# Patient Record
Sex: Female | Born: 1940
Health system: Southern US, Community
[De-identification: ages and names within clinical notes are randomized; demographics above are authoritative.]

## PROBLEM LIST (undated history)

## (undated) DIAGNOSIS — M1990 Unspecified osteoarthritis, unspecified site: Secondary | ICD-10-CM

## (undated) DIAGNOSIS — M5430 Sciatica, unspecified side: Secondary | ICD-10-CM

## (undated) DIAGNOSIS — Z972 Presence of dental prosthetic device (complete) (partial): Secondary | ICD-10-CM

## (undated) DIAGNOSIS — N184 Chronic kidney disease, stage 4 (severe): Secondary | ICD-10-CM

## (undated) DIAGNOSIS — M109 Gout, unspecified: Secondary | ICD-10-CM

## (undated) DIAGNOSIS — I10 Essential (primary) hypertension: Secondary | ICD-10-CM

## (undated) DIAGNOSIS — E039 Hypothyroidism, unspecified: Secondary | ICD-10-CM

## (undated) HISTORY — PX: TONSILLECTOMY AND ADENOIDECTOMY: SUR1326

## (undated) HISTORY — PX: COLONOSCOPY: SHX174

## (undated) HISTORY — DX: Essential (primary) hypertension: I10

## (undated) HISTORY — DX: Morbid (severe) obesity due to excess calories: E66.01

---
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
## 2005-02-14 ENCOUNTER — Ambulatory Visit: Payer: Self-pay | Admitting: Nephrology

## 2006-12-05 IMAGING — US US RENAL KIDNEY
1 series · 17 of 25 positions shown · non-contrast
Comparison: none

REASON FOR EXAM: chronic kidney disease    measure kidney size
COMMENTS:

[Series 1: us renal kidney · 17 of 28 slices shown]
[im 1/28]
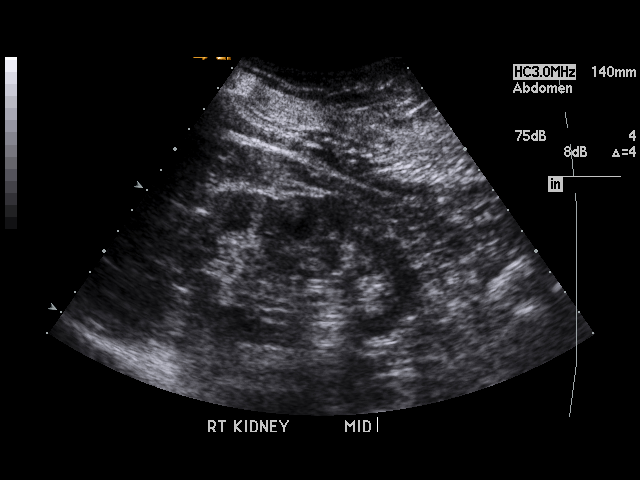
[im 3/28]
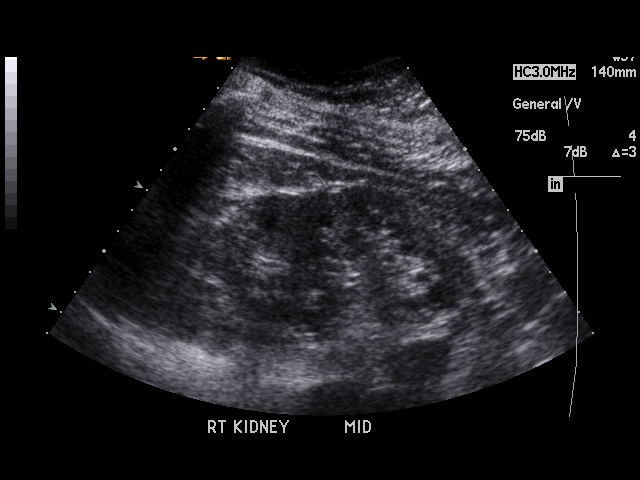
[im 4/28]
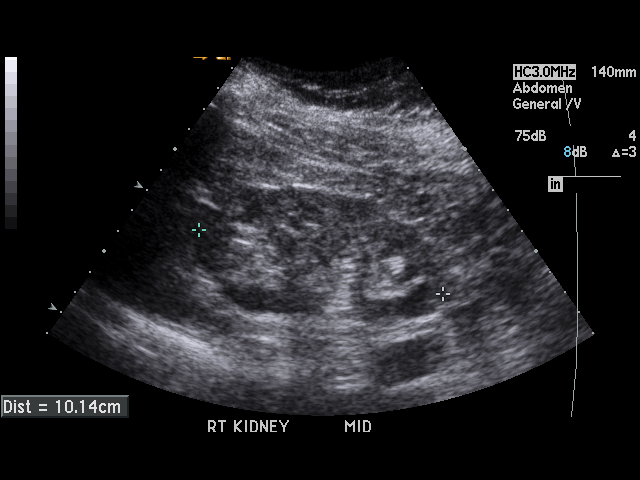
[im 6/28]
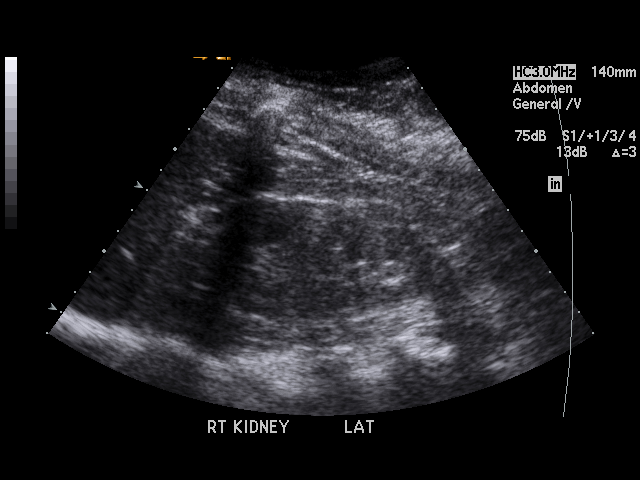
[im 7/28]
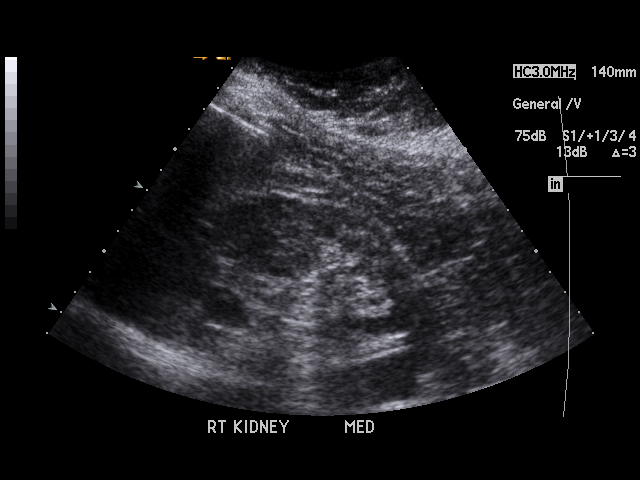
[im 10/28]
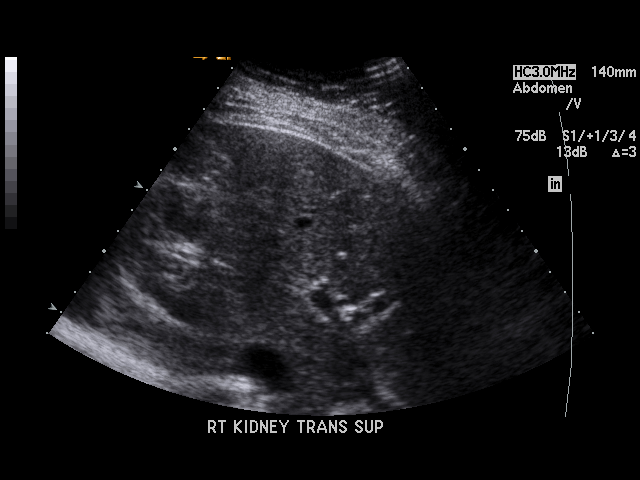
[im 11/28]
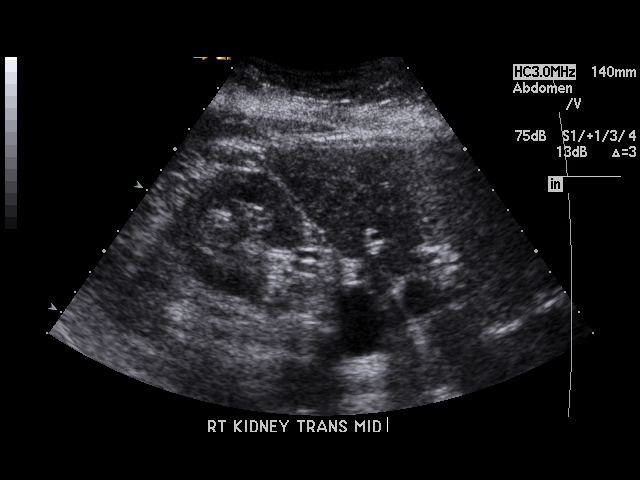
[im 13/28]
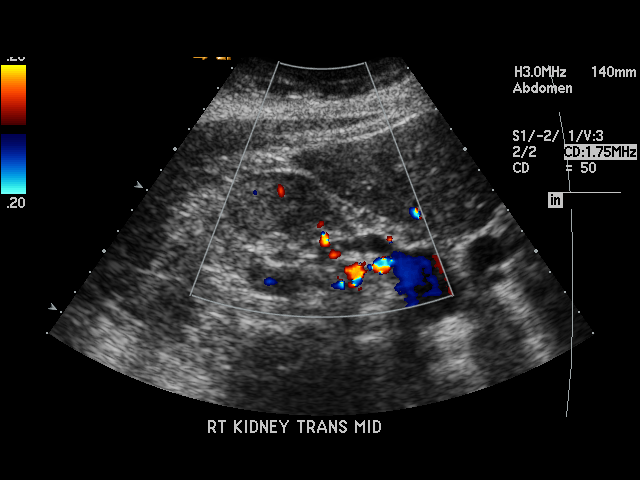
[im 14/28]
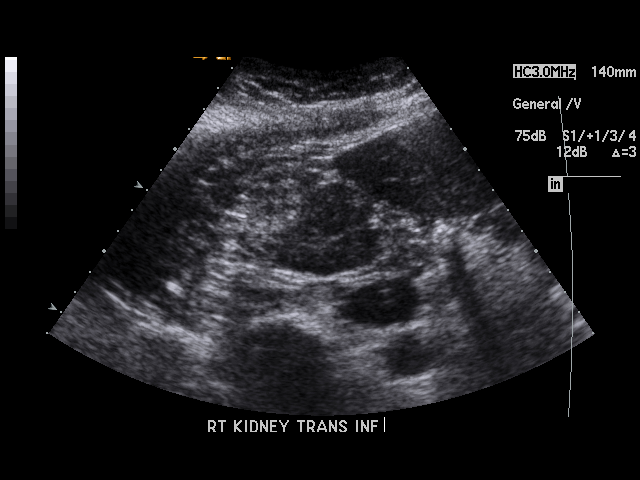
[im 15/28]
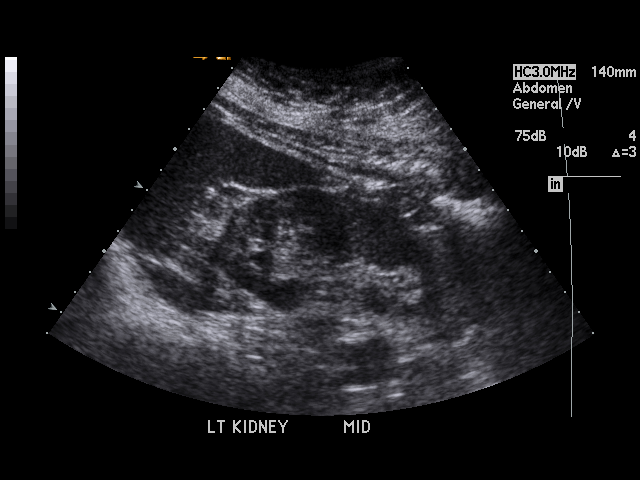
[im 17/28]
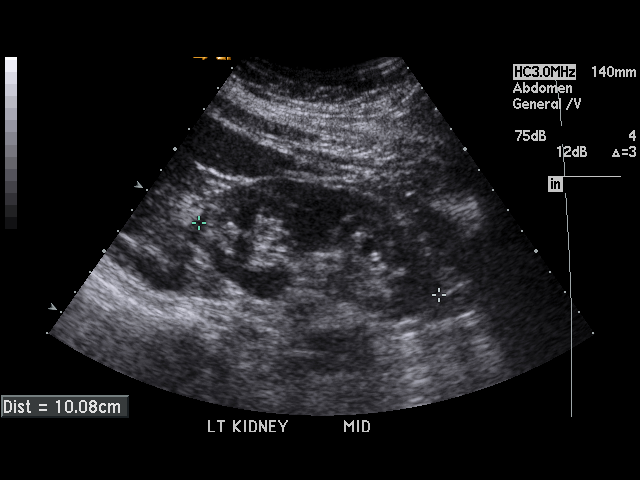
[im 19/28]
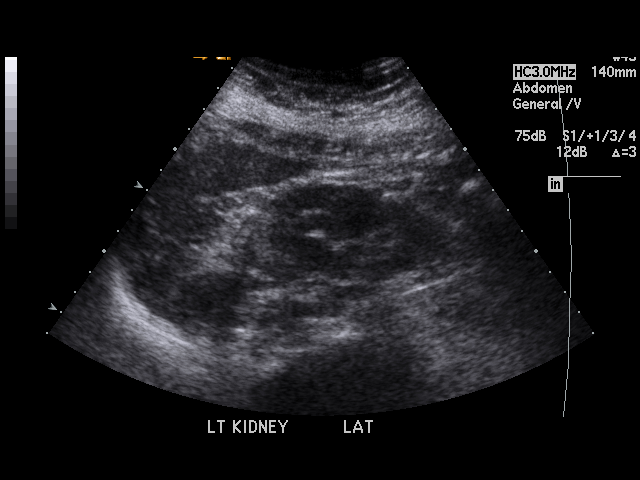
[im 21/28]
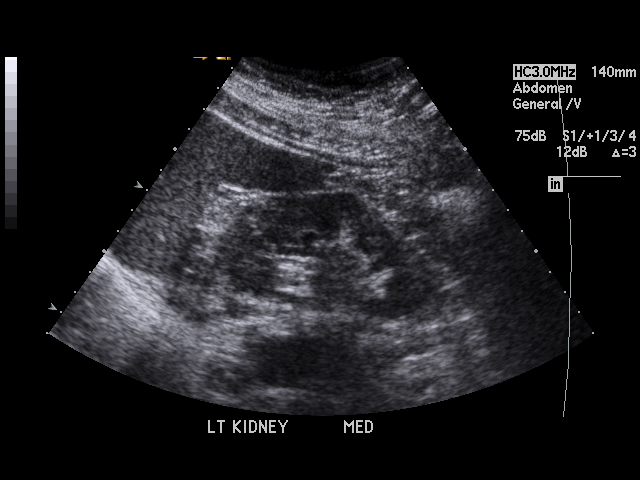
[im 22/28]
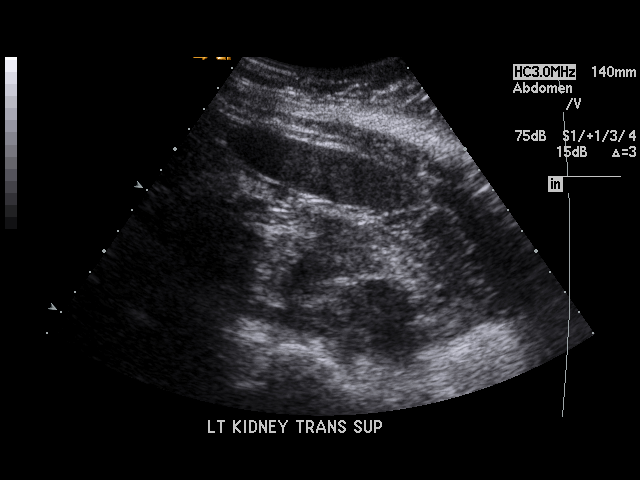
[im 24/28]
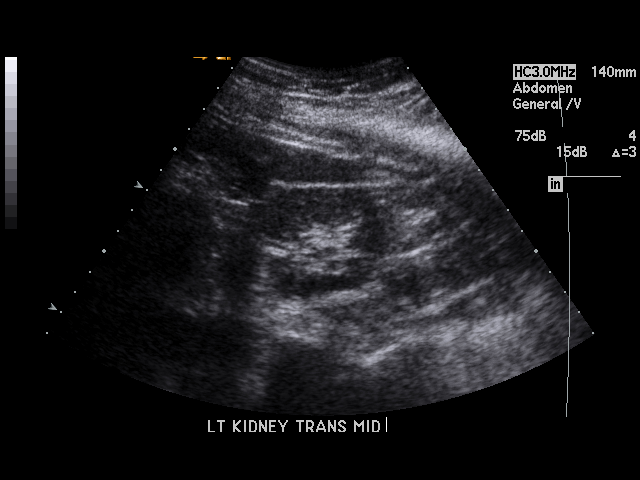
[im 25/28]
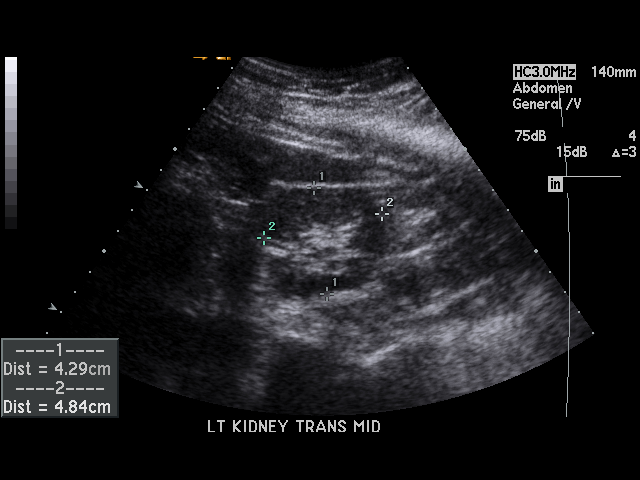
[im 28/28]
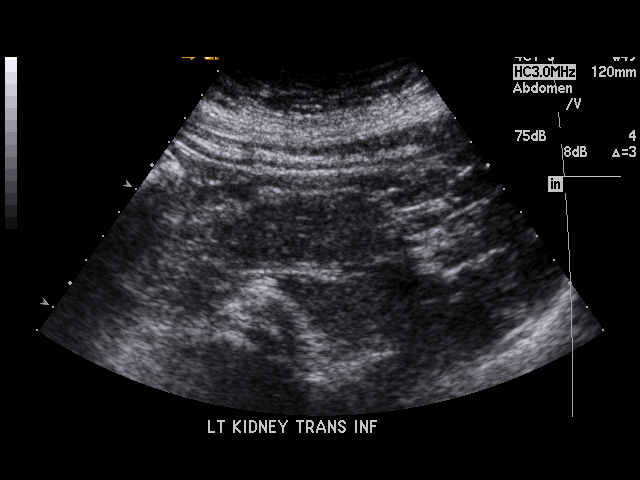

[17 of 25 positions shown; findings below may reference images not displayed]

PROCEDURE:     US  - US KIDNEY BILATERAL  - February 14, 2005  [DATE]

RESULT:     The study is limited because of the patient's morbid obesity.
The RIGHT kidney measures 10.1 cm x 4.8 cm x 3.9 cm and the LEFT kidney
measures 10.1 x 4.3 x 4.8 cm.  No hydronephrosis or definite cystic or solid
mass is appreciated. No stones are evident.
IMPRESSION: 1)Limited study technically, but no gross abnormality is seen.

## 2010-07-15 DIAGNOSIS — N184 Chronic kidney disease, stage 4 (severe): Secondary | ICD-10-CM | POA: Insufficient documentation

## 2011-12-04 ENCOUNTER — Ambulatory Visit: Payer: Self-pay | Admitting: Internal Medicine

## 2014-06-22 DIAGNOSIS — E039 Hypothyroidism, unspecified: Secondary | ICD-10-CM | POA: Diagnosis not present

## 2014-06-22 DIAGNOSIS — Z23 Encounter for immunization: Secondary | ICD-10-CM | POA: Diagnosis not present

## 2014-06-22 DIAGNOSIS — I129 Hypertensive chronic kidney disease with stage 1 through stage 4 chronic kidney disease, or unspecified chronic kidney disease: Secondary | ICD-10-CM | POA: Diagnosis not present

## 2014-06-22 DIAGNOSIS — Z0000 Encounter for general adult medical examination without abnormal findings: Secondary | ICD-10-CM | POA: Diagnosis not present

## 2014-06-22 LAB — TSH: TSH: 1.65 u[IU]/mL (ref 0.41–5.90)

## 2014-06-22 LAB — MICROALBUMIN, URINE: MICROALB UR: 0

## 2014-06-22 LAB — BASIC METABOLIC PANEL
BUN: 42 mg/dL — AB (ref 4–21)
CREATININE: 2.8 mg/dL — AB (ref 0.5–1.1)
Glucose: 93 mg/dL
POTASSIUM: 4.6 mmol/L (ref 3.4–5.3)
Sodium: 139 mmol/L (ref 137–147)

## 2014-06-22 LAB — CBC AND DIFFERENTIAL
HCT: 34 % — AB (ref 36–46)
Hemoglobin: 11.7 g/dL — AB (ref 12.0–16.0)
Neutrophils Absolute: 4 /uL
PLATELETS: 324 10*3/uL (ref 150–399)
WBC: 6.3 10^3/mL

## 2014-06-22 LAB — LIPID PANEL
Cholesterol: 151 mg/dL (ref 0–200)
HDL: 71 mg/dL — AB (ref 35–70)
LDL Cholesterol: 53 mg/dL
LDL/HDL RATIO: 0.7
TRIGLYCERIDES: 137 mg/dL (ref 40–160)

## 2014-06-22 LAB — HEPATIC FUNCTION PANEL
ALK PHOS: 133 U/L — AB (ref 25–125)
ALT: 9 U/L (ref 7–35)
AST: 16 U/L (ref 13–35)
Bilirubin, Total: 0.3 mg/dL

## 2014-07-20 ENCOUNTER — Other Ambulatory Visit: Payer: Self-pay | Admitting: Family Medicine

## 2014-07-20 DIAGNOSIS — I10 Essential (primary) hypertension: Secondary | ICD-10-CM | POA: Insufficient documentation

## 2014-11-17 DIAGNOSIS — N184 Chronic kidney disease, stage 4 (severe): Secondary | ICD-10-CM | POA: Diagnosis not present

## 2014-11-17 DIAGNOSIS — N179 Acute kidney failure, unspecified: Secondary | ICD-10-CM | POA: Diagnosis not present

## 2014-12-04 DIAGNOSIS — N179 Acute kidney failure, unspecified: Secondary | ICD-10-CM | POA: Diagnosis not present

## 2014-12-11 ENCOUNTER — Other Ambulatory Visit: Payer: Self-pay | Admitting: Family Medicine

## 2014-12-11 DIAGNOSIS — E039 Hypothyroidism, unspecified: Secondary | ICD-10-CM | POA: Insufficient documentation

## 2015-01-04 ENCOUNTER — Other Ambulatory Visit: Payer: Self-pay | Admitting: Family Medicine

## 2015-01-04 DIAGNOSIS — M109 Gout, unspecified: Secondary | ICD-10-CM

## 2015-01-04 NOTE — Telephone Encounter (Signed)
Last OV 06/2014  Thanks,   -Brendalyn Vallely  

## 2015-03-01 ENCOUNTER — Other Ambulatory Visit: Payer: Self-pay | Admitting: Family Medicine

## 2015-03-01 DIAGNOSIS — I10 Essential (primary) hypertension: Secondary | ICD-10-CM

## 2015-05-06 DIAGNOSIS — N184 Chronic kidney disease, stage 4 (severe): Secondary | ICD-10-CM | POA: Diagnosis not present

## 2015-05-11 ENCOUNTER — Other Ambulatory Visit: Payer: Self-pay | Admitting: Family Medicine

## 2015-05-11 DIAGNOSIS — R609 Edema, unspecified: Secondary | ICD-10-CM | POA: Insufficient documentation

## 2015-05-11 NOTE — Telephone Encounter (Signed)
LOV was AWV in May, 2016. Labs were stable except kidney function, which is F/B nephrology. Renaldo Fiddler, CMA

## 2015-06-08 DIAGNOSIS — I129 Hypertensive chronic kidney disease with stage 1 through stage 4 chronic kidney disease, or unspecified chronic kidney disease: Secondary | ICD-10-CM | POA: Diagnosis not present

## 2015-06-08 DIAGNOSIS — N184 Chronic kidney disease, stage 4 (severe): Secondary | ICD-10-CM | POA: Diagnosis not present

## 2015-06-14 DIAGNOSIS — M1990 Unspecified osteoarthritis, unspecified site: Secondary | ICD-10-CM | POA: Insufficient documentation

## 2015-06-14 DIAGNOSIS — IMO0002 Reserved for concepts with insufficient information to code with codable children: Secondary | ICD-10-CM | POA: Insufficient documentation

## 2015-06-14 DIAGNOSIS — E668 Other obesity: Secondary | ICD-10-CM | POA: Insufficient documentation

## 2015-06-14 DIAGNOSIS — N289 Disorder of kidney and ureter, unspecified: Secondary | ICD-10-CM | POA: Insufficient documentation

## 2015-06-14 DIAGNOSIS — E669 Obesity, unspecified: Secondary | ICD-10-CM | POA: Insufficient documentation

## 2015-06-15 ENCOUNTER — Other Ambulatory Visit: Payer: Self-pay | Admitting: Family Medicine

## 2015-06-15 DIAGNOSIS — E039 Hypothyroidism, unspecified: Secondary | ICD-10-CM

## 2015-06-23 ENCOUNTER — Ambulatory Visit (INDEPENDENT_AMBULATORY_CARE_PROVIDER_SITE_OTHER): Payer: Medicare Other | Admitting: Family Medicine

## 2015-06-23 ENCOUNTER — Encounter: Payer: Self-pay | Admitting: Family Medicine

## 2015-06-23 VITALS — BP 120/76 | HR 76 | Temp 98.3°F | Resp 16 | Ht 65.0 in | Wt 307.0 lb

## 2015-06-23 DIAGNOSIS — I10 Essential (primary) hypertension: Secondary | ICD-10-CM

## 2015-06-23 DIAGNOSIS — Z0000 Encounter for general adult medical examination without abnormal findings: Secondary | ICD-10-CM | POA: Diagnosis not present

## 2015-06-23 DIAGNOSIS — E039 Hypothyroidism, unspecified: Secondary | ICD-10-CM

## 2015-06-23 DIAGNOSIS — Z23 Encounter for immunization: Secondary | ICD-10-CM

## 2015-06-23 DIAGNOSIS — IMO0002 Reserved for concepts with insufficient information to code with codable children: Secondary | ICD-10-CM

## 2015-06-23 NOTE — Progress Notes (Signed)
Patient ID: Sherry Long, female   DOB: 1941/01/14, 75 y.o.   MRN: [id number]       Patient: Sherry Long, Female    DOB: 1940/03/25, 75 y.o.   MRN: [id number] Visit Date: 06/23/2015  Today's Provider: Margarita Rana, MD   Chief Complaint  Patient presents with  . Medicare Wellness   Subjective:    Annual wellness visit Sherry Long is a 75 y.o. female. She feels well. She reports exercising 3 days a week. She reports she is sleeping well.  06/22/14 AWE 05/23/11 BMD-Mild bone thinning in spine but hip fine, recheck in 3-5 years 01/09/02 Colonoscopy-diverticulosis, internal hemorrhoids -----------------------------------------------------------  Review of Systems  Constitutional: Negative.   HENT: Negative.   Eyes: Negative.   Respiratory: Negative.   Cardiovascular: Negative.   Gastrointestinal: Negative.   Endocrine: Negative.   Genitourinary: Negative.   Musculoskeletal: Positive for back pain and arthralgias.  Skin: Negative.   Allergic/Immunologic: Positive for environmental allergies.  Neurological: Negative.   Hematological: Negative.   Psychiatric/Behavioral: Negative.     Social History   Social History  . Marital Status: Married    Spouse Name: N/A  . Number of Children: N/A  . Years of Education: N/A   Occupational History  . Not on file.   Social History Main Topics  . Smoking status: Former Smoker    Quit date: 02/06/1976  . Smokeless tobacco: Never Used  . Alcohol Use: No  . Drug Use: No  . Sexual Activity: Not on file   Other Topics Concern  . Not on file   Social History Narrative    History reviewed. No pertinent past medical history.   Patient Active Problem List   Diagnosis Date Noted  . Arthritis, degenerative 06/14/2015  . Body mass index of 60 or higher (Sherry Long) 06/14/2015  . Impaired renal function 06/14/2015  . Extreme obesity ([place]) 06/14/2015  . Accumulation of fluid in tissues 05/11/2015  . Gout 01/04/2015  . Adult  hypothyroidism 12/11/2014  . Hypertension 07/20/2014  . Chronic kidney disease, stage IV (severe) ([place]) 07/15/2010    Past Surgical History  Procedure Laterality Date  . Tonsillectomy and adenoidectomy      Her family history includes Alzheimer's disease in her mother; Heart disease in her maternal grandmother, mother, and sister; Hypertension in her sister; Stroke in her father and maternal grandmother.    Previous Medications   ALLOPURINOL (ZYLOPRIM) 100 MG TABLET    TAKE TWO (2) TABLETS EVERY DAY   ASPIRIN 81 MG TABLET    Take 81 mg by mouth daily.    FUROSEMIDE (LASIX) 40 MG TABLET    TAKE ONE (1) TABLET EACH DAY   KETOCONAZOLE (NIZORAL) 2 % CREAM    Apply 1 application topically daily as needed.    LEVOTHYROXINE (SYNTHROID, LEVOTHROID) 137 MCG TABLET    TAKE 1 TABLET BY MOUTH EVERY DAY.   METOPROLOL TARTRATE (LOPRESSOR) 25 MG TABLET    TAKE 1 TABLET BY MOUTH TWICE A DAY.    Patient Care Team: Sherry Rana, MD as PCP - General (Family Medicine)     Objective:   Vitals: BP 120/76 mmHg  Pulse 76  Temp(Src) 98.3 F (36.8 C) (Oral)  Resp 16  Ht 5\' 5"  (1.651 m)  Wt 307 lb (139.254 kg)  BMI 51.09 kg/m2  Physical Exam  Constitutional: She is oriented to person, place, and time. She appears well-developed and well-nourished.  HENT:  Head: Normocephalic and atraumatic.  Right Ear: Tympanic membrane, external  ear and ear canal normal.  Left Ear: Tympanic membrane, external ear and ear canal normal.  Nose: Nose normal.  Mouth/Throat: Uvula is midline, oropharynx is clear and moist and mucous membranes are normal.  Eyes: Conjunctivae, EOM and lids are normal. Pupils are equal, round, and reactive to light.  Neck: Trachea normal and normal range of motion. Neck supple. Carotid bruit is not present. No thyroid mass and no thyromegaly present.  Cardiovascular: Normal rate, regular rhythm and normal heart sounds.   Pulmonary/Chest: Effort normal and breath sounds normal.    Abdominal: Soft. Normal appearance and bowel sounds are normal. There is no hepatosplenomegaly. There is no tenderness.  Musculoskeletal: Normal range of motion. She exhibits edema.  3+  Lymphadenopathy:    She has no cervical adenopathy.    She has no axillary adenopathy.  Neurological: She is alert and oriented to person, place, and time. She has normal strength. No cranial nerve deficit.  Skin: Skin is warm, dry and intact.  Psychiatric: She has a normal mood and affect. Her speech is normal and behavior is normal. Judgment and thought content normal. Cognition and memory are normal.    Activities of Daily Living In your present state of health, do you have any difficulty performing the following activities: 06/23/2015  Hearing? N  Vision? N  Difficulty concentrating or making decisions? Y  Walking or climbing stairs? Y  Dressing or bathing? N  Doing errands, shopping? N    Fall Risk Assessment Fall Risk  06/23/2015  Falls in the past year? No     Depression Screen PHQ 2/9 Scores 06/23/2015  PHQ - 2 Score 0    Cognitive Testing - 6-CIT  Correct? Score   What year is it? yes 0 0 or 4  What month is it? yes 0 0 or 3  Memorize:    Sherry Long,  42,  [address]  [place],      What time is it? (within 1 hour) yes 0 0 or 3  Count backwards from 20 yes 0 0, 2, or 4  Name the months of the year yes 0 0, 2, or 4  Repeat name & address above yes 0 0, 2, 4, 6, 8, or 10       TOTAL SCORE  0/28   Interpretation:  Normal  Normal (0-7) Abnormal (8-28)     Assessment & Plan:     Annual Wellness Visit  Reviewed patient's Family Medical History Reviewed and updated list of patient's medical providers Assessment of cognitive impairment was done Assessed patient's functional ability Established a written schedule for health screening [place] Completed and Reviewed  Exercise Activities and Dietary recommendations Goals    None      Immunization  History  Administered Date(s) Administered  . Pneumococcal Conjugate-13 06/22/2014  . Td 08/09/1998       1. Medicare annual wellness visit, subsequent Stable. Patient advised to continue eating healthy and exercise daily.  2. Essential hypertension Stable. Patient advised to continue current medication and plan of care.  3. Hypothyroidism, unspecified hypothyroidism type - TSH  4. Body mass index of 60 or higher ([place])  5. Need for pneumococcal vaccination - Pneumococcal polysaccharide vaccine 23-valent greater than or equal to 2yo subcutaneous/IM    Patient seen and examined by Dr. Jerrell Belfast, and note scribed by Philbert Riser. [place], CMA.  I have reviewed the document for accuracy and completeness and I agree with above. Jerrell Belfast,  MD   Sherry Rana, MD   ------------------------------------------------------------------------------------------------------------

## 2015-06-24 ENCOUNTER — Telehealth: Payer: Self-pay

## 2015-06-24 LAB — TSH: TSH: 3.3 u[IU]/mL (ref 0.450–4.500)

## 2015-06-24 NOTE — Telephone Encounter (Signed)
-----   Message from Margarita Rana, MD sent at 06/24/2015  6:14 AM EDT ----- Thyroid normal. Thanks.

## 2015-06-24 NOTE — Telephone Encounter (Signed)
Pt advised.   Thanks,   -Laura  

## 2015-06-24 NOTE — Telephone Encounter (Signed)
LMTCB  06/24/2015  Thanks,   -Mickel Baas

## 2015-08-30 ENCOUNTER — Other Ambulatory Visit: Payer: Self-pay | Admitting: Family Medicine

## 2015-08-30 DIAGNOSIS — I10 Essential (primary) hypertension: Secondary | ICD-10-CM

## 2015-09-30 ENCOUNTER — Other Ambulatory Visit: Payer: Self-pay | Admitting: Family Medicine

## 2015-09-30 DIAGNOSIS — R609 Edema, unspecified: Secondary | ICD-10-CM

## 2015-10-01 NOTE — Telephone Encounter (Signed)
Saw Dr. Venia Minks for wellness on 06/23/2015. No FU scheduled. Renaldo Fiddler, CMA

## 2015-12-09 ENCOUNTER — Other Ambulatory Visit: Payer: Self-pay | Admitting: Family Medicine

## 2015-12-09 DIAGNOSIS — E039 Hypothyroidism, unspecified: Secondary | ICD-10-CM

## 2015-12-10 NOTE — Telephone Encounter (Signed)
Please review. Going to be your patient?-aa

## 2015-12-31 ENCOUNTER — Other Ambulatory Visit: Payer: Self-pay | Admitting: Family Medicine

## 2015-12-31 DIAGNOSIS — M109 Gout, unspecified: Secondary | ICD-10-CM

## 2016-02-22 ENCOUNTER — Other Ambulatory Visit: Payer: Self-pay | Admitting: Physician Assistant

## 2016-02-22 DIAGNOSIS — I10 Essential (primary) hypertension: Secondary | ICD-10-CM

## 2016-02-22 NOTE — Telephone Encounter (Signed)
Saw Dr.Maloney 07/24/15 for AWE.  Thanks,  -Mikeya Tomasetti

## 2016-03-08 ENCOUNTER — Other Ambulatory Visit: Payer: Self-pay | Admitting: Physician Assistant

## 2016-03-08 DIAGNOSIS — E039 Hypothyroidism, unspecified: Secondary | ICD-10-CM

## 2016-05-11 ENCOUNTER — Telehealth: Payer: Self-pay | Admitting: Family Medicine

## 2016-05-11 NOTE — Telephone Encounter (Signed)
Called Pt to schedule AWV with [facility] line up with CPE in May - knb

## 2016-05-11 NOTE — Telephone Encounter (Signed)
Pt called back states she is not interested in having a AWV/MW

## 2016-05-22 ENCOUNTER — Other Ambulatory Visit: Payer: Self-pay | Admitting: Physician Assistant

## 2016-05-22 DIAGNOSIS — E039 Hypothyroidism, unspecified: Secondary | ICD-10-CM

## 2016-06-15 ENCOUNTER — Telehealth: Payer: Self-pay | Admitting: Family Medicine

## 2016-06-29 ENCOUNTER — Ambulatory Visit (INDEPENDENT_AMBULATORY_CARE_PROVIDER_SITE_OTHER): Payer: Medicare Other

## 2016-06-29 VITALS — BP 138/60 | HR 68 | Temp 97.5°F | Ht 65.0 in | Wt 295.6 lb

## 2016-06-29 DIAGNOSIS — Z0000 Encounter for general adult medical examination without abnormal findings: Secondary | ICD-10-CM

## 2016-06-29 NOTE — Progress Notes (Signed)
Subjective:   Sherry Long is a 76 y.o. female who presents for Medicare Annual (Subsequent) preventive examination.  Review of Systems:  N/A  Cardiac Risk Factors include: advanced age (>37men, >42 women);obesity (BMI >30kg/m2);dyslipidemia;hypertension     Objective:     Vitals: BP 138/60 (BP Location: Right Arm)   Pulse 68   Temp 97.5 F (36.4 C) (Oral)   Ht 5\' 5"  (1.651 m)   Wt 295 lb 9.6 oz (134.1 kg)   BMI 49.19 kg/m   Body mass index is 49.19 kg/m.   Tobacco History  Smoking Status  . Former Smoker  . Types: Cigarettes  . Quit date: 02/06/1976  Smokeless Tobacco  . Never Used     Counseling given: Not Answered   History reviewed. No pertinent past medical history. Past Surgical History:  Procedure Laterality Date  . TONSILLECTOMY AND ADENOIDECTOMY     Family History  Problem Relation Age of Onset  . Heart disease Mother   . Alzheimer's disease Mother   . Stroke Father   . Hypertension Sister   . Heart disease Sister   . Heart disease Maternal Grandmother   . Stroke Maternal Grandmother   . Colon cancer Other    History  Sexual Activity  . Sexual activity: Not on file    Outpatient Encounter Prescriptions as of 06/29/2016  Medication Sig  . allopurinol (ZYLOPRIM) 100 MG tablet TAKE TWO (2) TABLETS EVERY DAY  . aspirin 81 MG tablet Take 81 mg by mouth daily.   . furosemide (LASIX) 40 MG tablet TAKE 1 TABLET BY MOUTH EVERY DAY.  Marland Kitchen ketoconazole (NIZORAL) 2 % cream Apply 1 application topically daily as needed.   Marland Kitchen levothyroxine (SYNTHROID, LEVOTHROID) 137 MCG tablet TAKE 1 TABLET BY MOUTH EVERY DAY.  . metoprolol tartrate (LOPRESSOR) 25 MG tablet TAKE 1 TABLET BY MOUTH TWICE A DAY.  Marland Kitchen acetaminophen (TYLENOL) 500 MG tablet Take 500 mg by mouth as needed.   No facility-administered encounter medications on file as of 06/29/2016.     Activities of Daily Living In your present state of health, do you have any difficulty performing the following  activities: 06/29/2016  Hearing? N  Vision? N  Difficulty concentrating or making decisions? N  Walking or climbing stairs? Y  Dressing or bathing? N  Doing errands, shopping? N  Preparing Food and eating ? N  Using the Toilet? N  In the past six months, have you accidently leaked urine? N  Do you have problems with loss of bowel control? N  Managing your Medications? N  Managing your Finances? N  Housekeeping or managing your Housekeeping? N  Some recent data might be hidden    Patient Care Team: Mar Daring, PA-C as PCP - General (Family Medicine) [place], Thomasena Edis, MD as Referring Physician (Nephrology)    Assessment:     Exercise Activities and Dietary recommendations Current Exercise Habits: Home exercise routine, Type of exercise: stretching, Time (Minutes): 15 (to 20 minutes), Frequency (Times/Week): 2, Weekly Exercise (Minutes/Week): 30, Intensity: Mild, Exercise limited by: orthopedic condition(s)  Goals    . Reduce portion size          Recommend decreasing daily portion sizes.      Fall Risk Fall Risk  06/29/2016 06/23/2015  Falls in the past year? No No   Depression Screen PHQ 2/9 Scores 06/29/2016 06/29/2016 06/23/2015  PHQ - 2 Score 0 0 0  PHQ- 9 Score 3 - -     Cognitive Function  6CIT Screen 06/29/2016  What Year? 0 points  What month? 0 points  What time? 0 points  Count back from 20 0 points  Months in reverse 0 points  Repeat phrase 0 points  Total Score 0    Immunization History  Administered Date(s) Administered  . Pneumococcal Conjugate-13 06/22/2014  . Pneumococcal Polysaccharide-23 06/23/2015  . Td 08/09/1998   Screening Tests Health Maintenance  Topic Date Due  . INFLUENZA VACCINE  09/06/2016  . TETANUS/TDAP  10/23/2020  . DEXA SCAN  Completed  . PNA vac Low Risk Adult  Completed      Plan:  I have personally reviewed and addressed the Medicare Annual Wellness questionnaire and have noted the following in the  patient's chart:  A. Medical and social history B. Use of alcohol, tobacco or illicit drugs  C. Current medications and supplements D. Functional ability and status E.  Nutritional status F.  Physical activity G. Advance directives H. List of other physicians I.  Hospitalizations, surgeries, and ER visits in previous 12 months J.  [place] such as hearing and vision if needed, cognitive and depression L. Referrals and appointments - none  In addition, I have reviewed and discussed with patient certain preventive protocols, quality metrics, and best practice recommendations. A written personalized care plan for preventive services as well as general preventive health recommendations were provided to patient.  See attached scanned questionnaire for additional information.   Signed,  Fabio Neighbors, LPN Nurse Health Advisor   MD Recommendations: None.

## 2016-06-29 NOTE — Patient Instructions (Signed)
Sherry Long , Thank you for taking time to come for your Medicare Wellness Visit. I appreciate your ongoing commitment to your health goals. Please review the following plan we discussed and let me know if I can assist you in the future.   Screening recommendations/referrals: Colonoscopy: completed 06/22/14 Mammogram: completed 06/22/14 Bone Density: completed 05/23/11 Recommended yearly ophthalmology/optometry visit for glaucoma screening and checkup Recommended yearly dental visit for hygiene and checkup  Vaccinations: Influenza vaccine: due 10/2016 Pneumococcal vaccine: completed series Tdap vaccine: completed 10/24/2010, due 10/2020 Shingles vaccine: declined    Advanced directives: Advance directive discussed with you today. Even though you declined this today please call our office should you change your mind and we can give you the proper paperwork for you to fill out.  Conditions/risks identified: Recommend to continue decreasing portion sizes in daily diet.  Next appointment: 07/06/16 @ 10 AM   Preventive Care 65 Years and Older, Female Preventive care refers to lifestyle choices and visits with your health care provider that can promote health and wellness. What does preventive care include?  A yearly physical exam. This is also called an annual well check.  Dental exams once or twice a year.  Routine eye exams. Ask your health care provider how often you should have your eyes checked.  Personal lifestyle choices, including:  Daily care of your teeth and gums.  Regular physical activity.  Eating a healthy diet.  Avoiding tobacco and drug use.  Limiting alcohol use.  Practicing safe sex.  Taking low-dose aspirin every day.  Taking vitamin and mineral supplements as recommended by your health care provider. What happens during an annual well check? The services and screenings done by your health care provider during your annual well check will depend on your  age, overall health, lifestyle risk factors, and family history of disease. Counseling  Your health care provider may ask you questions about your:  Alcohol use.  Tobacco use.  Drug use.  Emotional well-being.  Home and relationship well-being.  Sexual activity.  Eating habits.  History of falls.  Memory and ability to understand (cognition).  Work and work Statistician.  Reproductive health. Screening  You may have the following tests or measurements:  Height, weight, and BMI.  Blood pressure.  Lipid and cholesterol levels. These may be checked every 5 years, or more frequently if you are over 31 years old.  Skin check.  Lung cancer screening. You may have this screening every year starting at age 69 if you have a 30-pack-year history of smoking and currently smoke or have quit within the past 15 years.  Fecal occult blood test (FOBT) of the stool. You may have this test every year starting at age 81.  Flexible sigmoidoscopy or colonoscopy. You may have a sigmoidoscopy every 5 years or a colonoscopy every 10 years starting at age 14.  Hepatitis C blood test.  Hepatitis B blood test.  Sexually transmitted disease (STD) testing.  Diabetes screening. This is done by checking your blood sugar (glucose) after you have not eaten for a while (fasting). You may have this done every 1-3 years.  Bone density scan. This is done to screen for osteoporosis. You may have this done starting at age 69.  Mammogram. This may be done every 1-2 years. Talk to your health care provider about how often you should have regular mammograms. Talk with your health care provider about your test results, treatment options, and if necessary, the need for more tests. Vaccines  Your  health care provider may recommend certain vaccines, such as:  Influenza vaccine. This is recommended every year.  Tetanus, diphtheria, and acellular pertussis (Tdap, Td) vaccine. You may need a Td booster  every 10 years.  Zoster vaccine. You may need this after age 40.  Pneumococcal 13-valent conjugate (PCV13) vaccine. One dose is recommended after age 13.  Pneumococcal polysaccharide (PPSV23) vaccine. One dose is recommended after age 36. Talk to your health care provider about which screenings and vaccines you need and how often you need them. This information is not intended to replace advice given to you by your health care provider. Make sure you discuss any questions you have with your health care provider. Document Released: 02/19/2015 Document Revised: 10/13/2015 Document Reviewed: 11/24/2014 Elsevier Interactive Patient Education  2017 [employer] Prevention in the Home Falls can cause injuries. They can happen to people of all ages. There are many things you can do to make your home safe and to help prevent falls. What can I do on the outside of my home?  Regularly fix the edges of walkways and driveways and fix any cracks.  Remove anything that might make you trip as you walk through a door, such as a raised step or threshold.  Trim any bushes or trees on the path to your home.  Use bright outdoor lighting.  Clear any walking paths of anything that might make someone trip, such as rocks or tools.  Regularly check to see if handrails are loose or broken. Make sure that both sides of any steps have handrails.  Any raised decks and porches should have guardrails on the edges.  Have any leaves, snow, or ice cleared regularly.  Use sand or salt on walking paths during winter.  Clean up any spills in your garage right away. This includes oil or grease spills. What can I do in the bathroom?  Use night lights.  Install grab bars by the toilet and in the tub and shower. Do not use towel bars as grab bars.  Use non-skid mats or decals in the tub or shower.  If you need to sit down in the shower, use a plastic, non-slip stool.  Keep the floor dry. Clean up any  water that spills on the floor as soon as it happens.  Remove soap buildup in the tub or shower regularly.  Attach bath mats securely with double-sided non-slip rug tape.  Do not have throw rugs and other things on the floor that can make you trip. What can I do in the bedroom?  Use night lights.  Make sure that you have a light by your bed that is easy to reach.  Do not use any sheets or blankets that are too big for your bed. They should not hang down onto the floor.  Have a firm chair that has side arms. You can use this for support while you get dressed.  Do not have throw rugs and other things on the floor that can make you trip. What can I do in the kitchen?  Clean up any spills right away.  Avoid walking on wet floors.  Keep items that you use a lot in easy-to-reach places.  If you need to reach something above you, use a strong step stool that has a grab bar.  Keep electrical cords out of the way.  Do not use floor polish or wax that makes floors slippery. If you must use wax, use non-skid floor wax.  Do not  have throw rugs and other things on the floor that can make you trip. What can I do with my stairs?  Do not leave any items on the stairs.  Make sure that there are handrails on both sides of the stairs and use them. Fix handrails that are broken or loose. Make sure that handrails are as long as the stairways.  Check any carpeting to make sure that it is firmly attached to the stairs. Fix any carpet that is loose or worn.  Avoid having throw rugs at the top or bottom of the stairs. If you do have throw rugs, attach them to the floor with carpet tape.  Make sure that you have a light switch at the top of the stairs and the bottom of the stairs. If you do not have them, ask someone to add them for you. What else can I do to help prevent falls?  Wear shoes that:  Do not have high heels.  Have rubber bottoms.  Are comfortable and fit you well.  Are closed  at the toe. Do not wear sandals.  If you use a stepladder:  Make sure that it is fully opened. Do not climb a closed stepladder.  Make sure that both sides of the stepladder are locked into place.  Ask someone to hold it for you, if possible.  Clearly mark and make sure that you can see:  Any grab bars or handrails.  First and last steps.  Where the edge of each step is.  Use tools that help you move around (mobility aids) if they are needed. These include:  Canes.  Walkers.  Scooters.  Crutches.  Turn on the lights when you go into a dark area. Replace any light bulbs as soon as they burn out.  Set up your furniture so you have a clear path. Avoid moving your furniture around.  If any of your floors are uneven, fix them.  If there are any pets around you, be aware of where they are.  Review your medicines with your doctor. Some medicines can make you feel dizzy. This can increase your chance of falling. Ask your doctor what other things that you can do to help prevent falls. This information is not intended to replace advice given to you by your health care provider. Make sure you discuss any questions you have with your health care provider. Document Released: 11/19/2008 Document Revised: 07/01/2015 Document Reviewed: 02/27/2014 Elsevier Interactive Patient Education  2017 [employer].

## 2016-07-06 ENCOUNTER — Encounter: Payer: Self-pay | Admitting: Physician Assistant

## 2016-07-06 ENCOUNTER — Ambulatory Visit (INDEPENDENT_AMBULATORY_CARE_PROVIDER_SITE_OTHER): Payer: Medicare Other | Admitting: Physician Assistant

## 2016-07-06 VITALS — BP 128/70 | HR 65 | Temp 98.1°F | Resp 16 | Ht 65.0 in | Wt 295.8 lb

## 2016-07-06 DIAGNOSIS — Z1231 Encounter for screening mammogram for malignant neoplasm of breast: Secondary | ICD-10-CM

## 2016-07-06 DIAGNOSIS — I10 Essential (primary) hypertension: Secondary | ICD-10-CM

## 2016-07-06 DIAGNOSIS — Z0000 Encounter for general adult medical examination without abnormal findings: Secondary | ICD-10-CM

## 2016-07-06 DIAGNOSIS — Z136 Encounter for screening for cardiovascular disorders: Secondary | ICD-10-CM

## 2016-07-06 DIAGNOSIS — E039 Hypothyroidism, unspecified: Secondary | ICD-10-CM | POA: Diagnosis not present

## 2016-07-06 DIAGNOSIS — Z6841 Body Mass Index (BMI) 40.0 and over, adult: Secondary | ICD-10-CM

## 2016-07-06 DIAGNOSIS — N184 Chronic kidney disease, stage 4 (severe): Secondary | ICD-10-CM | POA: Diagnosis not present

## 2016-07-06 DIAGNOSIS — Z13220 Encounter for screening for lipoid disorders: Secondary | ICD-10-CM

## 2016-07-06 DIAGNOSIS — Z1239 Encounter for other screening for malignant neoplasm of breast: Secondary | ICD-10-CM

## 2016-07-06 DIAGNOSIS — Z1211 Encounter for screening for malignant neoplasm of colon: Secondary | ICD-10-CM

## 2016-07-06 DIAGNOSIS — R609 Edema, unspecified: Secondary | ICD-10-CM

## 2016-07-06 DIAGNOSIS — M109 Gout, unspecified: Secondary | ICD-10-CM

## 2016-07-06 MED ORDER — LEVOTHYROXINE SODIUM 137 MCG PO TABS: 137.0000 ug | ORAL_TABLET | Freq: Every day | ORAL | 1 refills | Status: DC

## 2016-07-06 MED ORDER — ALLOPURINOL 100 MG PO TABS: ORAL_TABLET | ORAL | 1 refills | Status: DC

## 2016-07-06 MED ORDER — METOPROLOL TARTRATE 25 MG PO TABS: 25.0000 mg | ORAL_TABLET | Freq: Two times a day (BID) | ORAL | 1 refills | Status: DC

## 2016-07-06 MED ORDER — FUROSEMIDE 40 MG PO TABS: 40.0000 mg | ORAL_TABLET | Freq: Every day | ORAL | 1 refills | Status: DC

## 2016-07-06 NOTE — Patient Instructions (Signed)
Health Maintenance for Postmenopausal Women Menopause is a normal process in which your reproductive ability comes to an end. This process happens gradually over a span of months to years, usually between the ages of 22 and 9. Menopause is complete when you have missed 12 consecutive menstrual periods. It is important to talk with your health care provider about some of the most common conditions that affect postmenopausal women, such as heart disease, cancer, and bone loss (osteoporosis). Adopting a healthy lifestyle and getting preventive care can help to promote your health and wellness. Those actions can also lower your chances of developing some of these common conditions. What should I know about menopause? During menopause, you may experience a number of symptoms, such as:  Moderate-to-severe hot flashes.  Night sweats.  Decrease in sex drive.  Mood swings.  Headaches.  Tiredness.  Irritability.  Memory problems.  Insomnia.  Choosing to treat or not to treat menopausal changes is an individual decision that you make with your health care provider. What should I know about hormone replacement therapy and supplements? Hormone therapy products are effective for treating symptoms that are associated with menopause, such as hot flashes and night sweats. Hormone replacement carries certain risks, especially as you become older. If you are thinking about using estrogen or estrogen with progestin treatments, discuss the benefits and risks with your health care provider. What should I know about heart disease and stroke? Heart disease, heart attack, and stroke become more likely as you age. This may be due, in part, to the hormonal changes that your body experiences during menopause. These can affect how your body processes dietary fats, triglycerides, and cholesterol. Heart attack and stroke are both medical emergencies. There are many things that you can do to help prevent heart disease  and stroke:  Have your blood pressure checked at least every 1-2 years. High blood pressure causes heart disease and increases the risk of stroke.  If you are 53-22 years old, ask your health care provider if you should take aspirin to prevent a heart attack or a stroke.  Do not use any tobacco products, including cigarettes, chewing tobacco, or electronic cigarettes. If you need help quitting, ask your health care provider.  It is important to eat a healthy diet and maintain a healthy weight. ? Be sure to include plenty of vegetables, fruits, low-fat dairy products, and lean protein. ? Avoid eating foods that are high in solid fats, added sugars, or salt (sodium).  Get regular exercise. This is one of the most important things that you can do for your health. ? Try to exercise for at least 150 minutes each week. The type of exercise that you do should increase your heart rate and make you sweat. This is known as moderate-intensity exercise. ? Try to do strengthening exercises at least twice each week. Do these in addition to the moderate-intensity exercise.  Know your numbers.Ask your health care provider to check your cholesterol and your blood glucose. Continue to have your blood tested as directed by your health care provider.  What should I know about cancer screening? There are several types of cancer. Take the following steps to reduce your risk and to catch any cancer development as early as possible. Breast Cancer  Practice breast self-awareness. ? This means understanding how your breasts normally appear and feel. ? It also means doing regular breast self-exams. Let your health care provider know about any changes, no matter how small.  If you are 40  or older, have a clinician do a breast exam (clinical breast exam or CBE) every year. Depending on your age, family history, and medical history, it may be recommended that you also have a yearly breast X-ray (mammogram).  If you  have a family history of breast cancer, talk with your health care provider about genetic screening.  If you are at high risk for breast cancer, talk with your health care provider about having an MRI and a mammogram every year.  Breast cancer (BRCA) gene test is recommended for women who have family members with BRCA-related cancers. Results of the assessment will determine the need for genetic counseling and BRCA1 and for BRCA2 testing. BRCA-related cancers include these types: ? Breast. This occurs in males or females. ? Ovarian. ? Tubal. This may also be called fallopian tube cancer. ? Cancer of the abdominal or pelvic lining (peritoneal cancer). ? Prostate. ? Pancreatic.  Cervical, Uterine, and Ovarian Cancer Your health care provider may recommend that you be screened regularly for cancer of the pelvic organs. These include your ovaries, uterus, and vagina. This screening involves a pelvic exam, which includes checking for microscopic changes to the surface of your cervix (Pap test).  For women ages 21-65, health care providers may recommend a pelvic exam and a Pap test every three years. For women ages 79-65, they may recommend the Pap test and pelvic exam, combined with testing for human papilloma virus (HPV), every five years. Some types of HPV increase your risk of cervical cancer. Testing for HPV may also be done on women of any age who have unclear Pap test results.  Other health care providers may not recommend any screening for nonpregnant women who are considered low risk for pelvic cancer and have no symptoms. Ask your health care provider if a screening pelvic exam is right for you.  If you have had past treatment for cervical cancer or a condition that could lead to cancer, you need Pap tests and screening for cancer for at least 20 years after your treatment. If Pap tests have been discontinued for you, your risk factors (such as having a new sexual partner) need to be  reassessed to determine if you should start having screenings again. Some women have medical problems that increase the chance of getting cervical cancer. In these cases, your health care provider may recommend that you have screening and Pap tests more often.  If you have a family history of uterine cancer or ovarian cancer, talk with your health care provider about genetic screening.  If you have vaginal bleeding after reaching menopause, tell your health care provider.  There are currently no reliable tests available to screen for ovarian cancer.  Lung Cancer Lung cancer screening is recommended for adults 69-62 years old who are at high risk for lung cancer because of a history of smoking. A yearly low-dose CT scan of the lungs is recommended if you:  Currently smoke.  Have a history of at least 30 pack-years of smoking and you currently smoke or have quit within the past 15 years. A pack-year is smoking an average of one pack of cigarettes per day for one year.  Yearly screening should:  Continue until it has been 15 years since you quit.  Stop if you develop a health problem that would prevent you from having lung cancer treatment.  Colorectal Cancer  This type of cancer can be detected and can often be prevented.  Routine colorectal cancer screening usually begins at  age 42 and continues through age 45.  If you have risk factors for colon cancer, your health care provider may recommend that you be screened at an earlier age.  If you have a family history of colorectal cancer, talk with your health care provider about genetic screening.  Your health care provider may also recommend using home test kits to check for hidden blood in your stool.  A small camera at the end of a tube can be used to examine your colon directly (sigmoidoscopy or colonoscopy). This is done to check for the earliest forms of colorectal cancer.  Direct examination of the colon should be repeated every  5-10 years until age 71. However, if early forms of precancerous polyps or small growths are found or if you have a family history or genetic risk for colorectal cancer, you may need to be screened more often.  Skin Cancer  Check your skin from head to toe regularly.  Monitor any moles. Be sure to tell your health care provider: ? About any new moles or changes in moles, especially if there is a change in a mole's shape or color. ? If you have a mole that is larger than the size of a pencil eraser.  If any of your family members has a history of skin cancer, especially at a young age, talk with your health care provider about genetic screening.  Always use sunscreen. Apply sunscreen liberally and repeatedly throughout the day.  Whenever you are outside, protect yourself by wearing long sleeves, pants, a wide-brimmed hat, and sunglasses.  What should I know about osteoporosis? Osteoporosis is a condition in which bone destruction happens more quickly than new bone creation. After menopause, you may be at an increased risk for osteoporosis. To help prevent osteoporosis or the bone fractures that can happen because of osteoporosis, the following is recommended:  If you are 46-71 years old, get at least 1,000 mg of calcium and at least 600 mg of vitamin D per day.  If you are older than age 55 but younger than age 65, get at least 1,200 mg of calcium and at least 600 mg of vitamin D per day.  If you are older than age 54, get at least 1,200 mg of calcium and at least 800 mg of vitamin D per day.  Smoking and excessive alcohol intake increase the risk of osteoporosis. Eat foods that are rich in calcium and vitamin D, and do weight-bearing exercises several times each week as directed by your health care provider. What should I know about how menopause affects my mental health? Depression may occur at any age, but it is more common as you become older. Common symptoms of depression  include:  Low or sad mood.  Changes in sleep patterns.  Changes in appetite or eating patterns.  Feeling an overall lack of motivation or enjoyment of activities that you previously enjoyed.  Frequent crying spells.  Talk with your health care provider if you think that you are experiencing depression. What should I know about immunizations? It is important that you get and maintain your immunizations. These include:  Tetanus, diphtheria, and pertussis (Tdap) booster vaccine.  Influenza every year before the flu season begins.  Pneumonia vaccine.  Shingles vaccine.  Your health care provider may also recommend other immunizations. This information is not intended to replace advice given to you by your health care provider. Make sure you discuss any questions you have with your health care provider. Document Released: 03/17/2005  Document Revised: 08/13/2015 Document Reviewed: 10/27/2014 Elsevier Interactive Patient Education  2018 [employer]  

## 2016-07-06 NOTE — Progress Notes (Signed)
Patient: Sherry Long, Female    DOB: 05-22-1940, 76 y.o.   MRN: [id number] Visit Date: 07/06/2016  Today's Provider: Mar Daring, PA-C   Chief Complaint  Patient presents with  . Annual Exam   Subjective:    Annual physical exam Sherry Long is a 76 y.o. female who presents today for health maintenance and complete physical. She feels well. She reports exercising, stretching 15-20 minutes and then 30 minutes walking,2 times a week.  She reports she is sleeping well. She had her AWV on 06/29/16.  -----------------------------------------------------------------  Hypertension, follow-up:  BP Readings from Last 3 Encounters:  07/06/16 128/70  06/29/16 138/60  06/23/15 120/76    She was last seen for hypertension 12 months ago.  BP at that visit was 120/76. Management since that visit includes continue current medication. She reports excellent compliance with treatment. She is not having side effects.  She is adherent to low salt diet.   Outside blood pressures are 130's/80's. She is experiencing lower extremity edema.  Patient denies chest pain, chest pressure/discomfort, exertional chest pressure/discomfort, fatigue, irregular heart beat, near-syncope and palpitations.   Cardiovascular risk factors include advanced age (older than 80 for men, 25 for women), hypertension and obesity (BMI >= 30 kg/m2).   She is followed by Nephrology for CKD. She is scheduled to follow-up 06/05 @ 11:00am.    Weight trend: decreasing steadily Wt Readings from Last 3 Encounters:  07/06/16 295 lb 12.8 oz (134.2 kg)  06/29/16 295 lb 9.6 oz (134.1 kg)  06/23/15 (!) 307 lb (139.3 kg)    Current diet: balanced  ------------------------------------------------------------------------   Review of Systems  Constitutional: Negative.   HENT: Negative.   Eyes: Negative.   Respiratory: Negative.   Cardiovascular: Positive for leg swelling.  Gastrointestinal: Negative.     Endocrine: Negative.   Genitourinary: Negative.   Musculoskeletal: Negative.   Skin: Negative.   Allergic/Immunologic: Negative.   Neurological: Negative.   Hematological: Negative.   Psychiatric/Behavioral: Negative.     Social History      She  reports that she quit smoking about 40 years ago. Her smoking use included Cigarettes. She has never used smokeless tobacco. She reports that she does not drink alcohol or use drugs.       Social History   Social History  . Marital status: Married    Spouse name: N/A  . Number of children: N/A  . Years of education: N/A   Social History Main Topics  . Smoking status: Former Smoker    Types: Cigarettes    Quit date: 02/06/1976  . Smokeless tobacco: Never Used  . Alcohol use No  . Drug use: No  . Sexual activity: Not Asked   Other Topics Concern  . None   Social History Narrative  . None    No past medical history on file.   Patient Active Problem List   Diagnosis Date Noted  . Arthritis, degenerative 06/14/2015  . Body mass index of 60 or higher 06/14/2015  . Impaired renal function 06/14/2015  . Extreme obesity 06/14/2015  . Accumulation of fluid in tissues 05/11/2015  . Gout 01/04/2015  . Adult hypothyroidism 12/11/2014  . Hypertension 07/20/2014  . Chronic kidney disease, stage IV (severe) ([place]) 07/15/2010    Past Surgical History:  Procedure Laterality Date  . TONSILLECTOMY AND ADENOIDECTOMY      Family History        Family Status  Relation Status  . Mother Deceased  at age 24       (25) PNEUMONIA  . Father Deceased at age 46  . Sister Alive  . Brother Alive  . Sister Alive  . MGM (Not Specified)  . Other Deceased        Her family history includes Alzheimer's disease in her mother; Colon cancer in her other; Heart disease in her maternal grandmother, mother, and sister; Hypertension in her sister; Stroke in her father and maternal grandmother.     Allergies  Allergen Reactions  . Aspirin  Nausea Only    Upset stomach.  . Hydrochlorothiazide Other (See Comments)    Making gout worse     Current Outpatient Prescriptions:  .  acetaminophen (TYLENOL) 500 MG tablet, Take 500 mg by mouth as needed., Disp: , Rfl:  .  allopurinol (ZYLOPRIM) 100 MG tablet, TAKE TWO (2) TABLETS EVERY DAY, Disp: 60 tablet, Rfl: 5 .  aspirin 81 MG tablet, Take 81 mg by mouth daily. , Disp: , Rfl:  .  furosemide (LASIX) 40 MG tablet, TAKE 1 TABLET BY MOUTH EVERY DAY., Disp: 30 tablet, Rfl: 5 .  ketoconazole (NIZORAL) 2 % cream, Apply 1 application topically daily as needed. , Disp: , Rfl:  .  levothyroxine (SYNTHROID, LEVOTHROID) 137 MCG tablet, TAKE 1 TABLET BY MOUTH EVERY DAY., Disp: 30 tablet, Rfl: 0 .  metoprolol tartrate (LOPRESSOR) 25 MG tablet, TAKE 1 TABLET BY MOUTH TWICE A DAY., Disp: 180 tablet, Rfl: 1   Patient Care Team: Mar Daring, PA-C as PCP - General (Family Medicine) Ksh[place]agar, Thomasena Edis, MD as Referring Physician (Nephrology)      Objective:   Vitals: BP 128/70 (BP Location: Right Wrist, Patient Position: Sitting, Cuff Size: Normal)   Pulse 65   Temp 98.1 F (36.7 C) (Oral)   Resp 16   Ht 5\' 5"  (1.651 m)   Wt 295 lb 12.8 oz (134.2 kg)   SpO2 98%   BMI 49.22 kg/m    Physical Exam  Constitutional: She is oriented to person, place, and time. She appears well-developed and well-nourished. No distress.  HENT:  Head: Normocephalic and atraumatic.  Right Ear: Hearing, tympanic membrane, external ear and ear canal normal.  Left Ear: Hearing, tympanic membrane, external ear and ear canal normal.  Nose: Nose normal.  Mouth/Throat: Uvula is midline, oropharynx is clear and moist and mucous membranes are normal. No oropharyngeal exudate.  Eyes: Conjunctivae and EOM are normal. Pupils are equal, round, and reactive to light. Right eye exhibits no discharge. Left eye exhibits no discharge. No scleral icterus.  Neck: Normal range of motion. Neck supple. No JVD present.  Carotid bruit is not present. No tracheal deviation present. No thyromegaly present.  Cardiovascular: Normal rate, regular rhythm, normal heart sounds and intact distal pulses.  Exam reveals no gallop and no friction rub.   No murmur heard. When auscultating I had thought I may have heard a faint murmur, but when breath was held murmur was not auscultated.  Pulmonary/Chest: Effort normal and breath sounds normal. No respiratory distress. She has no wheezes. She has no rales. She exhibits no tenderness.  Abdominal: Soft. Bowel sounds are normal. She exhibits no distension and no mass. There is no tenderness. There is no rebound and no guarding.  Musculoskeletal: Normal range of motion. She exhibits edema (2+ pitting edema). She exhibits no tenderness.  Lymphadenopathy:    She has no cervical adenopathy.  Neurological: She is alert and oriented to person, place, and time.  Skin: Skin  is warm and dry. No rash noted. She is not diaphoretic.  Psychiatric: She has a normal mood and affect. Her behavior is normal. Judgment and thought content normal.  Vitals reviewed.    Depression Screen PHQ 2/9 Scores 06/29/2016 06/29/2016 06/23/2015  PHQ - 2 Score 0 0 0  PHQ- 9 Score 3 - -      Assessment & Plan:     Routine Health Maintenance and Physical Exam  Exercise Activities and Dietary recommendations Goals    . Reduce portion size          Recommend decreasing daily portion sizes.       Immunization History  Administered Date(s) Administered  . Pneumococcal Conjugate-13 06/22/2014  . Pneumococcal Polysaccharide-23 06/23/2015  . Td 08/09/1998    Health Maintenance  Topic Date Due  . INFLUENZA VACCINE  09/06/2016  . TETANUS/TDAP  10/23/2020  . DEXA SCAN  Completed  . PNA vac Low Risk Adult  Completed     Discussed health benefits of physical activity, and encouraged her to engage in regular exercise appropriate for her age and condition.    1. Annual physical exam Normal  physical exam for age. Up to date on vaccinations. Will check labs as below and f/u pending results. - CBC with Differential/Platelet - Comprehensive metabolic panel - Hemoglobin A1c - Lipid panel  2. Breast cancer screening Patient declines breast exam and mammograms.   3. Colon cancer screening Patient states she no longer gets colonoscopies. Refuses cologuard at this time. Nephew did pass of colon cancer last year but is reported to be from his dad's side of the family not her sister's.  4. Essential hypertension Stable. Continue Metoprolol 25mg  BID. Will check labs as below and f/u pending results. - CBC with Differential/Platelet - Comprehensive metabolic panel - Hemoglobin A1c - Lipid panel - metoprolol tartrate (LOPRESSOR) 25 MG tablet; Take 1 tablet (25 mg total) by mouth 2 (two) times daily.  Dispense: 180 tablet; Refill: 1  5. Adult hypothyroidism Stable. Continue current medical treatment plan. Will check labs as below and f/u pending results. - TSH  6. Chronic kidney disease, stage IV (severe) ([place]) Followed by Nephrology and has f/u next week. Will check labs as below and f/u pending results. - Comprehensive metabolic panel - Hemoglobin A1c - Lipid panel  7. Class 3 severe obesity due to excess calories with serious comorbidity and body mass index (BMI) of 50.0 to 59.9 in adult [facility]) Counseled patient on healthy lifestyle modifications including dieting and exercise.  Will check labs as below and f/u pending results. - CBC with Differential/Platelet - Comprehensive metabolic panel - Hemoglobin A1c  8. Gout, unspecified cause, unspecified chronicity, unspecified site Stable. No flares in a couple of years. Continue allopurinol 100mg  BID.  - Comprehensive metabolic panel - allopurinol (ZYLOPRIM) 100 MG tablet; TAKE TWO (2) TABLETS EVERY DAY  Dispense: 180 tablet; Refill: 1  9. Encounter for lipid screening for cardiovascular disease Will check labs as below and  f/u pending results. Family history of CAD. Father had stroke, mother had hypertension.  - Lipid panel  10. Hypothyroidism, unspecified type Stable. Diagnosis pulled for medication refill. Continue current medical treatment plan. - levothyroxine (SYNTHROID, LEVOTHROID) 137 MCG tablet; Take 1 tablet (137 mcg total) by mouth daily.  Dispense: 90 tablet; Refill: 1  11. Edema, unspecified type Stable. Diagnosis pulled for medication refill. Continue current medical treatment plan. - furosemide (LASIX) 40 MG tablet; Take 1 tablet (40 mg total) by mouth daily.  Dispense:  45 tablet; Refill: 1  --------------------------------------------------------------------    Mar Daring, PA-C  [facility]

## 2016-07-07 LAB — CBC WITH DIFFERENTIAL/PLATELET
BASOS: 0 %
Basophils Absolute: 0 10*3/uL (ref 0.0–0.2)
EOS (ABSOLUTE): 0 10*3/uL (ref 0.0–0.4)
EOS: 1 %
HEMATOCRIT: 33.5 % — AB (ref 34.0–46.6)
HEMOGLOBIN: 11.5 g/dL (ref 11.1–15.9)
Immature Grans (Abs): 0 10*3/uL (ref 0.0–0.1)
Immature Granulocytes: 0 %
LYMPHS ABS: 1.7 10*3/uL (ref 0.7–3.1)
Lymphs: 33 %
MCH: 32.1 pg (ref 26.6–33.0)
MCHC: 34.3 g/dL (ref 31.5–35.7)
MCV: 94 fL (ref 79–97)
MONOCYTES: 7 %
Monocytes Absolute: 0.4 10*3/uL (ref 0.1–0.9)
NEUTROS ABS: 3.2 10*3/uL (ref 1.4–7.0)
Neutrophils: 59 %
Platelets: 267 10*3/uL (ref 150–379)
RBC: 3.58 x10E6/uL — ABNORMAL LOW (ref 3.77–5.28)
RDW: 15.6 % — ABNORMAL HIGH (ref 12.3–15.4)
WBC: 5.3 10*3/uL (ref 3.4–10.8)

## 2016-07-07 LAB — COMPREHENSIVE METABOLIC PANEL
ALBUMIN: 4.1 g/dL (ref 3.5–4.8)
ALK PHOS: 121 IU/L — AB (ref 39–117)
ALT: 6 IU/L (ref 0–32)
AST: 12 IU/L (ref 0–40)
Albumin/Globulin Ratio: 1.4 (ref 1.2–2.2)
BILIRUBIN TOTAL: 0.3 mg/dL (ref 0.0–1.2)
BUN / CREAT RATIO: 13 (ref 12–28)
BUN: 31 mg/dL — ABNORMAL HIGH (ref 8–27)
CHLORIDE: 99 mmol/L (ref 96–106)
CO2: 25 mmol/L (ref 18–29)
CREATININE: 2.44 mg/dL — AB (ref 0.57–1.00)
Calcium: 10.2 mg/dL (ref 8.7–10.3)
GFR calc non Af Amer: 19 mL/min/{1.73_m2} — ABNORMAL LOW (ref >59)
GFR, EST AFRICAN AMERICAN: 22 mL/min/{1.73_m2} — AB (ref >59)
GLOBULIN, TOTAL: 3 g/dL (ref 1.5–4.5)
Glucose: 92 mg/dL (ref 65–99)
Potassium: 4.7 mmol/L (ref 3.5–5.2)
SODIUM: 137 mmol/L (ref 134–144)
TOTAL PROTEIN: 7.1 g/dL (ref 6.0–8.5)

## 2016-07-07 LAB — LIPID PANEL
CHOL/HDL RATIO: 2.2 ratio (ref 0.0–4.4)
Cholesterol, Total: 164 mg/dL (ref 100–199)
HDL: 75 mg/dL (ref >39)
LDL CALC: 67 mg/dL (ref 0–99)
Triglycerides: 110 mg/dL (ref 0–149)
VLDL Cholesterol Cal: 22 mg/dL (ref 5–40)

## 2016-07-07 LAB — TSH: TSH: 1.81 u[IU]/mL (ref 0.450–4.500)

## 2016-07-07 LAB — HEMOGLOBIN A1C
Est. average glucose Bld gHb Est-mCnc: 105 mg/dL
HEMOGLOBIN A1C: 5.3 % (ref 4.8–5.6)

## 2016-07-11 DIAGNOSIS — N184 Chronic kidney disease, stage 4 (severe): Secondary | ICD-10-CM | POA: Diagnosis not present

## 2016-11-08 NOTE — Telephone Encounter (Signed)
Issue resolved.

## 2017-01-01 ENCOUNTER — Other Ambulatory Visit: Payer: Self-pay | Admitting: Physician Assistant

## 2017-01-01 DIAGNOSIS — E039 Hypothyroidism, unspecified: Secondary | ICD-10-CM

## 2017-01-05 DIAGNOSIS — N184 Chronic kidney disease, stage 4 (severe): Secondary | ICD-10-CM | POA: Diagnosis not present

## 2017-01-09 DIAGNOSIS — N184 Chronic kidney disease, stage 4 (severe): Secondary | ICD-10-CM | POA: Diagnosis not present

## 2017-01-09 DIAGNOSIS — N2889 Other specified disorders of kidney and ureter: Secondary | ICD-10-CM | POA: Diagnosis not present

## 2017-01-09 DIAGNOSIS — I151 Hypertension secondary to other renal disorders: Secondary | ICD-10-CM | POA: Diagnosis not present

## 2017-01-09 DIAGNOSIS — N250 Renal osteodystrophy: Secondary | ICD-10-CM | POA: Diagnosis not present

## 2017-01-11 ENCOUNTER — Ambulatory Visit: Payer: Self-pay | Admitting: Physician Assistant

## 2017-02-09 ENCOUNTER — Other Ambulatory Visit: Payer: Self-pay | Admitting: Physician Assistant

## 2017-02-09 DIAGNOSIS — R609 Edema, unspecified: Secondary | ICD-10-CM

## 2017-02-16 ENCOUNTER — Other Ambulatory Visit: Payer: Self-pay | Admitting: Physician Assistant

## 2017-02-16 DIAGNOSIS — I10 Essential (primary) hypertension: Secondary | ICD-10-CM

## 2017-06-21 ENCOUNTER — Other Ambulatory Visit: Payer: Self-pay | Admitting: Physician Assistant

## 2017-06-21 DIAGNOSIS — E039 Hypothyroidism, unspecified: Secondary | ICD-10-CM

## 2017-07-04 ENCOUNTER — Encounter: Payer: Self-pay | Admitting: Physician Assistant

## 2017-07-04 ENCOUNTER — Ambulatory Visit (INDEPENDENT_AMBULATORY_CARE_PROVIDER_SITE_OTHER): Payer: Medicare Other

## 2017-07-04 ENCOUNTER — Ambulatory Visit (INDEPENDENT_AMBULATORY_CARE_PROVIDER_SITE_OTHER): Payer: Medicare Other | Admitting: Physician Assistant

## 2017-07-04 VITALS — BP 138/68 | HR 68 | Temp 98.4°F | Resp 16 | Ht 65.0 in | Wt 285.4 lb

## 2017-07-04 VITALS — BP 138/68 | HR 68 | Temp 98.4°F | Ht 65.0 in | Wt 285.4 lb

## 2017-07-04 DIAGNOSIS — E039 Hypothyroidism, unspecified: Secondary | ICD-10-CM

## 2017-07-04 DIAGNOSIS — Z0000 Encounter for general adult medical examination without abnormal findings: Secondary | ICD-10-CM

## 2017-07-04 DIAGNOSIS — N184 Chronic kidney disease, stage 4 (severe): Secondary | ICD-10-CM | POA: Diagnosis not present

## 2017-07-04 DIAGNOSIS — I10 Essential (primary) hypertension: Secondary | ICD-10-CM

## 2017-07-04 DIAGNOSIS — Z6841 Body Mass Index (BMI) 40.0 and over, adult: Secondary | ICD-10-CM | POA: Diagnosis not present

## 2017-07-04 NOTE — Progress Notes (Signed)
Patient: Sherry Long, Female    DOB: 03-20-1940, 77 y.o.   MRN: [id number] Visit Date: 07/04/2017  Today's Provider: Mar Daring, PA-C   Chief Complaint  Patient presents with  . Hypertension   Subjective:    Hypertension, follow-up:  BP Readings from Last 3 Encounters:  07/04/17 138/68  07/04/17 138/68  07/06/16 128/70    She was last seen for hypertension 1 years ago.  BP at that visit was 128/70. Management changes since that visit include no changes. She reports excellent compliance with treatment. She is not having side effects.  She is not exercising. She is adherent to low salt diet.   Outside blood pressures are not being checked. She is experiencing lower extremity edema.  Patient denies chest pain.   Cardiovascular risk factors include advanced age (older than 51 for men, 68 for women), hypertension and obesity (BMI >= 30 kg/m2).  Use of agents associated with hypertension: none.     Weight trend: stable Wt Readings from Last 3 Encounters:  07/04/17 285 lb 6.4 oz (129.5 kg)  07/04/17 285 lb 6.4 oz (129.5 kg)  07/06/16 295 lb 12.8 oz (134.2 kg)    Current diet: in general, a "healthy" diet    Of note her husband recently passed away in 04/21/2022. They have no children. She now currently lives alone but feels she is doing ok. Her siblings and her husband's siblings all live locally and check on her.  ------------------------------------------------------------------------  Review of Systems  Constitutional: Negative.   HENT: Positive for postnasal drip, rhinorrhea and sneezing.   Eyes: Positive for itching.  Respiratory: Negative.   Cardiovascular: Positive for leg swelling.  Gastrointestinal: Negative.   Endocrine: Negative.   Genitourinary: Negative.   Musculoskeletal: Positive for arthralgias.  Skin: Negative.   Allergic/Immunologic: Negative.   Neurological: Negative.   Hematological: Negative.   Psychiatric/Behavioral:  Negative.     Social History   Socioeconomic History  . Marital status: Widowed    Spouse name: Not on file  . Number of children: 1  . Years of education: Not on file  . Highest education level: Associate degree: occupational, Hotel manager, or vocational program  Occupational History  . Occupation: retired  Scientific laboratory technician  . Financial resource strain: Not hard at all  . Food insecurity:    Worry: Never true    Inability: Never true  . Transportation needs:    Medical: No    Non-medical: No  Tobacco Use  . Smoking status: Former Smoker    Types: Cigarettes    Last attempt to quit: 02/06/1976    Years since quitting: 41.4  . Smokeless tobacco: Never Used  . Tobacco comment: quit in 20's  Substance and Sexual Activity  . Alcohol use: No  . Drug use: No  . Sexual activity: Not on file  Lifestyle  . Physical activity:    Days per week: Not on file    Minutes per session: Not on file  . Stress: To some extent  Relationships  . Social connections:    Talks on phone: Not on file    Gets together: Not on file    Attends religious service: Not on file    Active member of club or organization: Not on file    Attends meetings of clubs or organizations: Not on file    Relationship status: Not on file  . Intimate partner violence:    Fear of current or ex partner: Not on file  Emotionally abused: Not on file    Physically abused: Not on file    Forced sexual activity: Not on file  Other Topics Concern  . Not on file  Social History Narrative   1 son - deceased    Past Medical History:  Diagnosis Date  . Hypertension   . Morbid obesity [facility])      Patient Active Problem List   Diagnosis Date Noted  . Class 3 severe obesity due to excess calories with serious comorbidity and body mass index (BMI) of 50.0 to 59.9 in adult ([place]) 07/06/2016  . Arthritis, degenerative 06/14/2015  . Accumulation of fluid in tissues 05/11/2015  . Gout 01/04/2015  . Adult hypothyroidism  12/11/2014  . Hypertension 07/20/2014  . Chronic kidney disease, stage IV (severe) ([place]) 07/15/2010    Past Surgical History:  Procedure Laterality Date  . TONSILLECTOMY AND ADENOIDECTOMY      Her family history includes Alzheimer's disease in her mother; Colon cancer in her other; Heart disease in her maternal grandmother, mother, and sister; Hypertension in her sister; Stroke in her father and maternal grandmother.      Current Outpatient Medications:  .  acetaminophen (TYLENOL) 500 MG tablet, Take 500 mg by mouth as needed., Disp: , Rfl:  .  allopurinol (ZYLOPRIM) 100 MG tablet, TAKE TWO (2) TABLETS EVERY DAY, Disp: 180 tablet, Rfl: 1 .  aspirin 81 MG tablet, Take 81 mg by mouth daily. , Disp: , Rfl:  .  furosemide (LASIX) 40 MG tablet, TAKE 1 TABLET BY MOUTH EVERY DAY, Disp: 90 tablet, Rfl: 1 .  Iron-Vitamins (GERITOL COMPLETE PO), Take by mouth 2 (two) times a week., Disp: , Rfl:  .  ketoconazole (NIZORAL) 2 % cream, Apply 1 application topically daily as needed. , Disp: , Rfl:  .  levothyroxine (SYNTHROID, LEVOTHROID) 137 MCG tablet, TAKE 1 TABLET BY MOUTH DAILY, Disp: 90 tablet, Rfl: 1 .  metoprolol tartrate (LOPRESSOR) 25 MG tablet, TAKE 1 TABLET BY MOUTH TWICE A DAY, Disp: 180 tablet, Rfl: 1  Patient Care Team: Mar Daring, PA-C as PCP - General (Family Medicine) Kshirsagar, Thomasena Edis, MD as Referring Physician (Nephrology)     Objective:   Vitals: BP 138/68 (BP Location: Right Arm, Patient Position: Sitting, Cuff Size: Large)   Pulse 68   Temp 98.4 F (36.9 C) (Oral)   Resp 16   Ht 5\' 5"  (1.651 m)   Wt 285 lb 6.4 oz (129.5 kg)   BMI 47.49 kg/m   Physical Exam  Constitutional: She is oriented to person, place, and time. She appears well-developed and well-nourished. No distress.  HENT:  Head: Normocephalic and atraumatic.  Right Ear: External ear normal.  Left Ear: External ear normal.  Nose: Nose normal.  Mouth/Throat: Oropharynx is clear and moist.  No oropharyngeal exudate.  Eyes: Pupils are equal, round, and reactive to light. Conjunctivae and EOM are normal. Right eye exhibits no discharge. Left eye exhibits no discharge. No scleral icterus.  Neck: Normal range of motion. Neck supple. No JVD present. Carotid bruit is not present. No tracheal deviation present. No thyromegaly present.  Cardiovascular: Normal rate, regular rhythm, normal heart sounds and intact distal pulses. Exam reveals no gallop and no friction rub.  No murmur heard. Pulmonary/Chest: Effort normal and breath sounds normal. No respiratory distress. She has no wheezes. She has no rales. She exhibits no tenderness.  Abdominal: Soft. Bowel sounds are normal. She exhibits no distension and no mass. There is no tenderness. There is  no rebound and no guarding.  Musculoskeletal: Normal range of motion. She exhibits no edema or tenderness.  Lymphadenopathy:    She has no cervical adenopathy.  Neurological: She is alert and oriented to person, place, and time.  Skin: Skin is warm and dry. No rash noted. She is not diaphoretic.  Psychiatric: She has a normal mood and affect. Her behavior is normal. Judgment and thought content normal.  Vitals reviewed.   Activities of Daily Living In your present state of health, do you have any difficulty performing the following activities: 07/04/2017  Hearing? N  Vision? N  Difficulty concentrating or making decisions? N  Walking or climbing stairs? Y  Comment Due to knee pain.   Dressing or bathing? N  Doing errands, shopping? N  Preparing Food and eating ? N  Using the Toilet? N  In the past six months, have you accidently leaked urine? N  Do you have problems with loss of bowel control? N  Managing your Medications? N  Managing your Finances? N  Housekeeping or managing your Housekeeping? N  Some recent data might be hidden    Fall Risk Assessment Fall Risk  07/04/2017 06/29/2016 06/23/2015  Falls in the past year? No No No       Depression Screen PHQ 2/9 Scores 07/04/2017 07/04/2017 06/29/2016 06/29/2016  PHQ - 2 Score 1 1 0 0  PHQ- 9 Score 5 - 3 -    Cognitive Testing - 6-CIT  Correct? Score   What year is it? yes 0 0 or 4  What month is it? yes 0 0 or 3  Memorize:    Pia Mau,  42,  [address],  [place],      What time is it? (within 1 hour) yes 0 0 or 3  Count backwards from 20 yes 0 0, 2, or 4  Name the months of the year yes 0 0, 2, or 4  Repeat name & address above yes 0 0, 2, 4, 6, 8, or 10       TOTAL SCORE  0/28   Interpretation:  Normal  Normal (0-7) Abnormal (8-28)       Assessment & Plan:    Annual Physical Reviewed patient's Family Medical History Reviewed and updated list of patient's medical providers Assessment of cognitive impairment was done Assessed patient's functional ability Established a written schedule for health screening [place] Completed and Reviewed  Exercise Activities and Dietary recommendations Goals    None      Immunization History  Administered Date(s) Administered  . Pneumococcal Conjugate-13 06/22/2014  . Pneumococcal Polysaccharide-23 06/23/2015  . Td 08/09/1998    Health Maintenance  Topic Date Due  . INFLUENZA VACCINE  09/06/2017  . TETANUS/TDAP  10/23/2020  . DEXA SCAN  Completed  . PNA vac Low Risk Adult  Completed     Discussed health benefits of physical activity, and encouraged her to engage in regular exercise appropriate for her age and condition.    1. Essential hypertension Normal physical exam today. Will check labs as below and f/u pending lab results. If labs are stable and WNL she will not need to have these rechecked for one year at her next physical exam. She is to call the office in the meantime if she has any acute issue, questions or concerns. - CBC w/Diff/Platelet - Comprehensive Metabolic Panel (CMET) - Lipid Profile  2. Adult hypothyroidism Stable on levothyroxine 162mcg. Will check labs as  below and f/u pending  results. - TSH  3. Chronic kidney disease, stage IV (severe) ([place]) Followed by [facility], Dr. Kathrynn Ducking.  - CBC w/Diff/Platelet - Comprehensive Metabolic Panel (CMET) - Lipid Profile  4. Class 3 severe obesity due to excess calories with serious comorbidity and body mass index (BMI) of 45.0 to 49.9 in adult [facility]) Counseled patient on healthy lifestyle modifications including dieting and exercise.  - CBC w/Diff/Platelet - Comprehensive Metabolic Panel (CMET) - Lipid Profile  ------------------------------------------------------------------------------------------------------------    Mar Daring, PA-C  [facility]

## 2017-07-04 NOTE — Progress Notes (Signed)
Subjective:   Sherry Long is a 78 y.o. female who presents for Medicare Annual (Subsequent) preventive examination.  Review of Systems:  N/A  Cardiac Risk Factors include: advanced age (>46men, >62 women);hypertension;obesity (BMI >30kg/m2)     Objective:     Vitals: BP 138/68 (BP Location: Right Arm)   Pulse 68   Temp 98.4 F (36.9 C) (Oral)   Ht 5\' 5"  (1.651 m)   Wt 285 lb 6.4 oz (129.5 kg)   BMI 47.49 kg/m   Body mass index is 47.49 kg/m.  Advanced Directives 07/04/2017 07/06/2016 06/29/2016 06/23/2015  Does Patient Have a Medical Advance Directive? No No No No  Would patient like information on creating a medical advance directive? Yes (MAU/Ambulatory/Procedural Areas - Information given) - No - Patient declined -    Tobacco Social History   Tobacco Use  Smoking Status Former Smoker  . Types: Cigarettes  . Last attempt to quit: 02/06/1976  . Years since quitting: 41.4  Smokeless Tobacco Never Used  Tobacco Comment   quit in 20's     Counseling given: Not Answered Comment: quit in 20's   Clinical Intake:  Pre-visit preparation completed: Yes  Pain : No/denies pain Pain Score: 0-No pain     Nutritional Status: BMI > 30  Obese Nutritional Risks: None Diabetes: No  How often do you need to have someone help you when you read instructions, pamphlets, or other written materials from your doctor or pharmacy?: 1 - Never  Interpreter Needed?: No  Information entered by :: [facility], LPN  Past Medical History:  Diagnosis Date  . Hypertension   . Morbid obesity ([place])    Past Surgical History:  Procedure Laterality Date  . TONSILLECTOMY AND ADENOIDECTOMY     Family History  Problem Relation Age of Onset  . Heart disease Mother   . Alzheimer's disease Mother   . Stroke Father   . Hypertension Sister   . Heart disease Sister   . Heart disease Maternal Grandmother   . Stroke Maternal Grandmother   . Colon cancer Other    Social History    Socioeconomic History  . Marital status: Widowed    Spouse name: Not on file  . Number of children: 1  . Years of education: Not on file  . Highest education level: Associate degree: occupational, Hotel manager, or vocational program  Occupational History  . Occupation: retired  Scientific laboratory technician  . Financial resource strain: Not hard at all  . Food insecurity:    Worry: Never true    Inability: Never true  . Transportation needs:    Medical: No    Non-medical: No  Tobacco Use  . Smoking status: Former Smoker    Types: Cigarettes    Last attempt to quit: 02/06/1976    Years since quitting: 41.4  . Smokeless tobacco: Never Used  . Tobacco comment: quit in 20's  Substance and Sexual Activity  . Alcohol use: No  . Drug use: No  . Sexual activity: Not on file  Lifestyle  . Physical activity:    Days per week: Not on file    Minutes per session: Not on file  . Stress: To some extent  Relationships  . Social connections:    Talks on phone: Not on file    Gets together: Not on file    Attends religious service: Not on file    Active member of club or organization: Not on file    Attends meetings of clubs or  organizations: Not on file    Relationship status: Not on file  Other Topics Concern  . Not on file  Social History Narrative   1 son - deceased    Outpatient Encounter Medications as of 07/04/2017  Medication Sig  . acetaminophen (TYLENOL) 500 MG tablet Take 500 mg by mouth as needed.  Marland Kitchen allopurinol (ZYLOPRIM) 100 MG tablet TAKE TWO (2) TABLETS EVERY DAY  . aspirin 81 MG tablet Take 81 mg by mouth daily.   . furosemide (LASIX) 40 MG tablet TAKE 1 TABLET BY MOUTH EVERY DAY  . Iron-Vitamins (GERITOL COMPLETE PO) Take by mouth 2 (two) times a week.  Marland Kitchen ketoconazole (NIZORAL) 2 % cream Apply 1 application topically daily as needed.   Marland Kitchen levothyroxine (SYNTHROID, LEVOTHROID) 137 MCG tablet TAKE 1 TABLET BY MOUTH DAILY  . metoprolol tartrate (LOPRESSOR) 25 MG tablet TAKE 1  TABLET BY MOUTH TWICE A DAY   No facility-administered encounter medications on file as of 07/04/2017.     Activities of Daily Living In your present state of health, do you have any difficulty performing the following activities: 07/04/2017  Hearing? N  Vision? N  Difficulty concentrating or making decisions? N  Walking or climbing stairs? Y  Comment Due to knee pain.   Dressing or bathing? N  Doing errands, shopping? N  Preparing Food and eating ? N  Using the Toilet? N  In the past six months, have you accidently leaked urine? N  Do you have problems with loss of bowel control? N  Managing your Medications? N  Managing your Finances? N  Housekeeping or managing your Housekeeping? N  Some recent data might be hidden    Patient Care Team: Mar Daring, PA-C as PCP - General (Family Medicine) Kshirsagar, Thomasena Edis, MD as Referring Physician (Nephrology)    Assessment:   This is a routine wellness examination for Sherry Long.  Exercise Activities and Dietary recommendations Current Exercise Habits: The patient does not participate in regular exercise at present, Exercise limited by: None identified  Goals    None      Fall Risk Fall Risk  07/04/2017 06/29/2016 06/23/2015  Falls in the past year? No No No   Is the patient's home free of loose throw rugs in walkways, pet beds, electrical cords, etc?   yes      Grab bars in the bathroom? yes      Handrails on the stairs?   no      Adequate lighting?   yes  Timed Get Up and Go performed: N/A  Depression Screen PHQ 2/9 Scores 07/04/2017 07/04/2017 06/29/2016 06/29/2016  PHQ - 2 Score 1 1 0 0  PHQ- 9 Score 5 - 3 -     Cognitive Function: Pt declined screening today.      6CIT Screen 06/29/2016  What Year? 0 points  What month? 0 points  What time? 0 points  Count back from 20 0 points  Months in reverse 0 points  Repeat phrase 0 points  Total Score 0    Immunization History  Administered Date(s) Administered   . Pneumococcal Conjugate-13 06/22/2014  . Pneumococcal Polysaccharide-23 06/23/2015  . Td 08/09/1998    Qualifies for Shingles Vaccine? Due for Shingles vaccine. Declined my offer to administer today. Education has been provided regarding the importance of this vaccine. Pt has been advised to call her insurance company to determine her out of pocket expense. Advised she may also receive this vaccine at her local pharmacy or  Health Dept. Verbalized acceptance and understanding.  Screening Tests Health Maintenance  Topic Date Due  . INFLUENZA VACCINE  09/06/2017  . TETANUS/TDAP  10/23/2020  . DEXA SCAN  Completed  . PNA vac Low Risk Adult  Completed    Cancer Screenings: Lung: Low Dose CT Chest recommended if Age 59-80 years, 30 pack-year currently smoking OR have quit w/in 15years. Patient does not qualify. Breast:  Up to date on Mammogram? Yes   Up to date of Bone Density/Dexa? Yes Colorectal: Up to date  Additional Screenings:  Hepatitis C Screening: N/A     Plan:  I have personally reviewed and addressed the Medicare Annual Wellness questionnaire and have noted the following in the patient's chart:  A. Medical and social history B. Use of alcohol, tobacco or illicit drugs  C. Current medications and supplements D. Functional ability and status E.  Nutritional status F.  Physical activity G. Advance directives H. List of other physicians I.  Hospitalizations, surgeries, and ER visits in previous 12 months J.  [place] such as hearing and vision if needed, cognitive and depression L. Referrals and appointments - none  In addition, I have reviewed and discussed with patient certain preventive protocols, quality metrics, and best practice recommendations. A written personalized care plan for preventive services as well as general preventive health recommendations were provided to patient.  See attached scanned questionnaire for additional information.    Signed,  Fabio Neighbors, LPN Nurse Health Advisor   Nurse Recommendations: None.

## 2017-07-04 NOTE — Patient Instructions (Addendum)
Sherry Long , Thank you for taking time to come for your Medicare Wellness Visit. I appreciate your ongoing commitment to your health goals. Please review the following plan we discussed and let me know if I can assist you in the future.   Screening recommendations/referrals: Colonoscopy: Up to date Mammogram: Up to date Bone Density: Up to date Recommended yearly ophthalmology/optometry visit for glaucoma screening and checkup Recommended yearly dental visit for hygiene and checkup  Vaccinations: Influenza vaccine: N/A Pneumococcal vaccine: Up to date Tdap vaccine: Up to date Shingles vaccine: Pt declines today.     Advanced directives: Advance directive discussed with you today. I have provided a copy for you to complete at home and have notarized. Once this is complete please bring a copy in to our office so we can scan it into your chart.  Conditions/risks identified: Obesity- Recommend to continue cutting out the extra snacking and avoiding junk food.   Next appointment: 10:40 AM today with Fenton Malling.   Preventive Care 89 Years and Older, Female Preventive care refers to lifestyle choices and visits with your health care provider that can promote health and wellness. What does preventive care include?  A yearly physical exam. This is also called an annual well check.  Dental exams once or twice a year.  Routine eye exams. Ask your health care provider how often you should have your eyes checked.  Personal lifestyle choices, including:  Daily care of your teeth and gums.  Regular physical activity.  Eating a healthy diet.  Avoiding tobacco and drug use.  Limiting alcohol use.  Practicing safe sex.  Taking low-dose aspirin every day.  Taking vitamin and mineral supplements as recommended by your health care provider. What happens during an annual well check? The services and screenings done by your health care provider during your annual well check will  depend on your age, overall health, lifestyle risk factors, and family history of disease. Counseling  Your health care provider may ask you questions about your:  Alcohol use.  Tobacco use.  Drug use.  Emotional well-being.  Home and relationship well-being.  Sexual activity.  Eating habits.  History of falls.  Memory and ability to understand (cognition).  Work and work Statistician.  Reproductive health. Screening  You may have the following tests or measurements:  Height, weight, and BMI.  Blood pressure.  Lipid and cholesterol levels. These may be checked every 5 years, or more frequently if you are over 45 years old.  Skin check.  Lung cancer screening. You may have this screening every year starting at age 57 if you have a 30-pack-year history of smoking and currently smoke or have quit within the past 15 years.  Fecal occult blood test (FOBT) of the stool. You may have this test every year starting at age 47.  Flexible sigmoidoscopy or colonoscopy. You may have a sigmoidoscopy every 5 years or a colonoscopy every 10 years starting at age 72.  Hepatitis C blood test.  Hepatitis B blood test.  Sexually transmitted disease (STD) testing.  Diabetes screening. This is done by checking your blood sugar (glucose) after you have not eaten for a while (fasting). You may have this done every 1-3 years.  Bone density scan. This is done to screen for osteoporosis. You may have this done starting at age 40.  Mammogram. This may be done every 1-2 years. Talk to your health care provider about how often you should have regular mammograms. Talk with your health care  provider about your test results, treatment options, and if necessary, the need for more tests. Vaccines  Your health care provider may recommend certain vaccines, such as:  Influenza vaccine. This is recommended every year.  Tetanus, diphtheria, and acellular pertussis (Tdap, Td) vaccine. You may need a  Td booster every 10 years.  Zoster vaccine. You may need this after age 35.  Pneumococcal 13-valent conjugate (PCV13) vaccine. One dose is recommended after age 81.  Pneumococcal polysaccharide (PPSV23) vaccine. One dose is recommended after age 6. Talk to your health care provider about which screenings and vaccines you need and how often you need them. This information is not intended to replace advice given to you by your health care provider. Make sure you discuss any questions you have with your health care provider. Document Released: 02/19/2015 Document Revised: 10/13/2015 Document Reviewed: 11/24/2014 Elsevier Interactive Patient Education  2017 [place] Prevention in the Home Falls can cause injuries. They can happen to people of all ages. There are many things you can do to make your home safe and to help prevent falls. What can I do on the outside of my home?  Regularly fix the edges of walkways and driveways and fix any cracks.  Remove anything that might make you trip as you walk through a door, such as a raised step or threshold.  Trim any bushes or trees on the path to your home.  Use bright outdoor lighting.  Clear any walking paths of anything that might make someone trip, such as rocks or tools.  Regularly check to see if handrails are loose or broken. Make sure that both sides of any steps have handrails.  Any raised decks and porches should have guardrails on the edges.  Have any leaves, snow, or ice cleared regularly.  Use sand or salt on walking paths during winter.  Clean up any spills in your garage right away. This includes oil or grease spills. What can I do in the bathroom?  Use night lights.  Install grab bars by the toilet and in the tub and shower. Do not use towel bars as grab bars.  Use non-skid mats or decals in the tub or shower.  If you need to sit down in the shower, use a plastic, non-slip stool.  Keep the floor dry. Clean  up any water that spills on the floor as soon as it happens.  Remove soap buildup in the tub or shower regularly.  Attach bath mats securely with double-sided non-slip rug tape.  Do not have throw rugs and other things on the floor that can make you trip. What can I do in the bedroom?  Use night lights.  Make sure that you have a light by your bed that is easy to reach.  Do not use any sheets or blankets that are too big for your bed. They should not hang down onto the floor.  Have a firm chair that has side arms. You can use this for support while you get dressed.  Do not have throw rugs and other things on the floor that can make you trip. What can I do in the kitchen?  Clean up any spills right away.  Avoid walking on wet floors.  Keep items that you use a lot in easy-to-reach places.  If you need to reach something above you, use a strong step stool that has a grab bar.  Keep electrical cords out of the way.  Do not use floor polish  or wax that makes floors slippery. If you must use wax, use non-skid floor wax.  Do not have throw rugs and other things on the floor that can make you trip. What can I do with my stairs?  Do not leave any items on the stairs.  Make sure that there are handrails on both sides of the stairs and use them. Fix handrails that are broken or loose. Make sure that handrails are as long as the stairways.  Check any carpeting to make sure that it is firmly attached to the stairs. Fix any carpet that is loose or worn.  Avoid having throw rugs at the top or bottom of the stairs. If you do have throw rugs, attach them to the floor with carpet tape.  Make sure that you have a light switch at the top of the stairs and the bottom of the stairs. If you do not have them, ask someone to add them for you. What else can I do to help prevent falls?  Wear shoes that:  Do not have high heels.  Have rubber bottoms.  Are comfortable and fit you well.  Are  closed at the toe. Do not wear sandals.  If you use a stepladder:  Make sure that it is fully opened. Do not climb a closed stepladder.  Make sure that both sides of the stepladder are locked into place.  Ask someone to hold it for you, if possible.  Clearly mark and make sure that you can see:  Any grab bars or handrails.  First and last steps.  Where the edge of each step is.  Use tools that help you move around (mobility aids) if they are needed. These include:  Canes.  Walkers.  Scooters.  Crutches.  Turn on the lights when you go into a dark area. Replace any light bulbs as soon as they burn out.  Set up your furniture so you have a clear path. Avoid moving your furniture around.  If any of your floors are uneven, fix them.  If there are any pets around you, be aware of where they are.  Review your medicines with your doctor. Some medicines can make you feel dizzy. This can increase your chance of falling. Ask your doctor what other things that you can do to help prevent falls. This information is not intended to replace advice given to you by your health care provider. Make sure you discuss any questions you have with your health care provider. Document Released: 11/19/2008 Document Revised: 07/01/2015 Document Reviewed: 02/27/2014 Elsevier Interactive Patient Education  2017 [employer].

## 2017-07-04 NOTE — Patient Instructions (Signed)
Health Maintenance for Postmenopausal Women Menopause is a normal process in which your reproductive ability comes to an end. This process happens gradually over a span of months to years, usually between the ages of 22 and 9. Menopause is complete when you have missed 12 consecutive menstrual periods. It is important to talk with your health care provider about some of the most common conditions that affect postmenopausal women, such as heart disease, cancer, and bone loss (osteoporosis). Adopting a healthy lifestyle and getting preventive care can help to promote your health and wellness. Those actions can also lower your chances of developing some of these common conditions. What should I know about menopause? During menopause, you may experience a number of symptoms, such as:  Moderate-to-severe hot flashes.  Night sweats.  Decrease in sex drive.  Mood swings.  Headaches.  Tiredness.  Irritability.  Memory problems.  Insomnia.  Choosing to treat or not to treat menopausal changes is an individual decision that you make with your health care provider. What should I know about hormone replacement therapy and supplements? Hormone therapy products are effective for treating symptoms that are associated with menopause, such as hot flashes and night sweats. Hormone replacement carries certain risks, especially as you become older. If you are thinking about using estrogen or estrogen with progestin treatments, discuss the benefits and risks with your health care provider. What should I know about heart disease and stroke? Heart disease, heart attack, and stroke become more likely as you age. This may be due, in part, to the hormonal changes that your body experiences during menopause. These can affect how your body processes dietary fats, triglycerides, and cholesterol. Heart attack and stroke are both medical emergencies. There are many things that you can do to help prevent heart disease  and stroke:  Have your blood pressure checked at least every 1-2 years. High blood pressure causes heart disease and increases the risk of stroke.  If you are 53-22 years old, ask your health care provider if you should take aspirin to prevent a heart attack or a stroke.  Do not use any tobacco products, including cigarettes, chewing tobacco, or electronic cigarettes. If you need help quitting, ask your health care provider.  It is important to eat a healthy diet and maintain a healthy weight. ? Be sure to include plenty of vegetables, fruits, low-fat dairy products, and lean protein. ? Avoid eating foods that are high in solid fats, added sugars, or salt (sodium).  Get regular exercise. This is one of the most important things that you can do for your health. ? Try to exercise for at least 150 minutes each week. The type of exercise that you do should increase your heart rate and make you sweat. This is known as moderate-intensity exercise. ? Try to do strengthening exercises at least twice each week. Do these in addition to the moderate-intensity exercise.  Know your numbers.Ask your health care provider to check your cholesterol and your blood glucose. Continue to have your blood tested as directed by your health care provider.  What should I know about cancer screening? There are several types of cancer. Take the following steps to reduce your risk and to catch any cancer development as early as possible. Breast Cancer  Practice breast self-awareness. ? This means understanding how your breasts normally appear and feel. ? It also means doing regular breast self-exams. Let your health care provider know about any changes, no matter how small.  If you are 40  or older, have a clinician do a breast exam (clinical breast exam or CBE) every year. Depending on your age, family history, and medical history, it may be recommended that you also have a yearly breast X-ray (mammogram).  If you  have a family history of breast cancer, talk with your health care provider about genetic screening.  If you are at high risk for breast cancer, talk with your health care provider about having an MRI and a mammogram every year.  Breast cancer (BRCA) gene test is recommended for women who have family members with BRCA-related cancers. Results of the assessment will determine the need for genetic counseling and BRCA1 and for BRCA2 testing. BRCA-related cancers include these types: ? Breast. This occurs in males or females. ? Ovarian. ? Tubal. This may also be called fallopian tube cancer. ? Cancer of the abdominal or pelvic lining (peritoneal cancer). ? Prostate. ? Pancreatic.  Cervical, Uterine, and Ovarian Cancer Your health care provider may recommend that you be screened regularly for cancer of the pelvic organs. These include your ovaries, uterus, and vagina. This screening involves a pelvic exam, which includes checking for microscopic changes to the surface of your cervix (Pap test).  For women ages 21-65, health care providers may recommend a pelvic exam and a Pap test every three years. For women ages 79-65, they may recommend the Pap test and pelvic exam, combined with testing for human papilloma virus (HPV), every five years. Some types of HPV increase your risk of cervical cancer. Testing for HPV may also be done on women of any age who have unclear Pap test results.  Other health care providers may not recommend any screening for nonpregnant women who are considered low risk for pelvic cancer and have no symptoms. Ask your health care provider if a screening pelvic exam is right for you.  If you have had past treatment for cervical cancer or a condition that could lead to cancer, you need Pap tests and screening for cancer for at least 20 years after your treatment. If Pap tests have been discontinued for you, your risk factors (such as having a new sexual partner) need to be  reassessed to determine if you should start having screenings again. Some women have medical problems that increase the chance of getting cervical cancer. In these cases, your health care provider may recommend that you have screening and Pap tests more often.  If you have a family history of uterine cancer or ovarian cancer, talk with your health care provider about genetic screening.  If you have vaginal bleeding after reaching menopause, tell your health care provider.  There are currently no reliable tests available to screen for ovarian cancer.  Lung Cancer Lung cancer screening is recommended for adults 69-62 years old who are at high risk for lung cancer because of a history of smoking. A yearly low-dose CT scan of the lungs is recommended if you:  Currently smoke.  Have a history of at least 30 pack-years of smoking and you currently smoke or have quit within the past 15 years. A pack-year is smoking an average of one pack of cigarettes per day for one year.  Yearly screening should:  Continue until it has been 15 years since you quit.  Stop if you develop a health problem that would prevent you from having lung cancer treatment.  Colorectal Cancer  This type of cancer can be detected and can often be prevented.  Routine colorectal cancer screening usually begins at  age 42 and continues through age 45.  If you have risk factors for colon cancer, your health care provider may recommend that you be screened at an earlier age.  If you have a family history of colorectal cancer, talk with your health care provider about genetic screening.  Your health care provider may also recommend using home test kits to check for hidden blood in your stool.  A small camera at the end of a tube can be used to examine your colon directly (sigmoidoscopy or colonoscopy). This is done to check for the earliest forms of colorectal cancer.  Direct examination of the colon should be repeated every  5-10 years until age 71. However, if early forms of precancerous polyps or small growths are found or if you have a family history or genetic risk for colorectal cancer, you may need to be screened more often.  Skin Cancer  Check your skin from head to toe regularly.  Monitor any moles. Be sure to tell your health care provider: ? About any new moles or changes in moles, especially if there is a change in a mole's shape or color. ? If you have a mole that is larger than the size of a pencil eraser.  If any of your family members has a history of skin cancer, especially at a young age, talk with your health care provider about genetic screening.  Always use sunscreen. Apply sunscreen liberally and repeatedly throughout the day.  Whenever you are outside, protect yourself by wearing long sleeves, pants, a wide-brimmed hat, and sunglasses.  What should I know about osteoporosis? Osteoporosis is a condition in which bone destruction happens more quickly than new bone creation. After menopause, you may be at an increased risk for osteoporosis. To help prevent osteoporosis or the bone fractures that can happen because of osteoporosis, the following is recommended:  If you are 46-71 years old, get at least 1,000 mg of calcium and at least 600 mg of vitamin D per day.  If you are older than age 55 but younger than age 65, get at least 1,200 mg of calcium and at least 600 mg of vitamin D per day.  If you are older than age 54, get at least 1,200 mg of calcium and at least 800 mg of vitamin D per day.  Smoking and excessive alcohol intake increase the risk of osteoporosis. Eat foods that are rich in calcium and vitamin D, and do weight-bearing exercises several times each week as directed by your health care provider. What should I know about how menopause affects my mental health? Depression may occur at any age, but it is more common as you become older. Common symptoms of depression  include:  Low or sad mood.  Changes in sleep patterns.  Changes in appetite or eating patterns.  Feeling an overall lack of motivation or enjoyment of activities that you previously enjoyed.  Frequent crying spells.  Talk with your health care provider if you think that you are experiencing depression. What should I know about immunizations? It is important that you get and maintain your immunizations. These include:  Tetanus, diphtheria, and pertussis (Tdap) booster vaccine.  Influenza every year before the flu season begins.  Pneumonia vaccine.  Shingles vaccine.  Your health care provider may also recommend other immunizations. This information is not intended to replace advice given to you by your health care provider. Make sure you discuss any questions you have with your health care provider. Document Released: 03/17/2005  Document Revised: 08/13/2015 Document Reviewed: 10/27/2014 Elsevier Interactive Patient Education  2018 [employer]  

## 2017-07-05 LAB — COMPREHENSIVE METABOLIC PANEL
A/G RATIO: 1.6 (ref 1.2–2.2)
ALK PHOS: 139 IU/L — AB (ref 39–117)
ALT: 6 IU/L (ref 0–32)
AST: 14 IU/L (ref 0–40)
Albumin: 4.1 g/dL (ref 3.5–4.8)
BUN/Creatinine Ratio: 13 (ref 12–28)
BUN: 32 mg/dL — ABNORMAL HIGH (ref 8–27)
Bilirubin Total: 0.3 mg/dL (ref 0.0–1.2)
CHLORIDE: 99 mmol/L (ref 96–106)
CO2: 20 mmol/L (ref 20–29)
CREATININE: 2.47 mg/dL — AB (ref 0.57–1.00)
Calcium: 10.1 mg/dL (ref 8.7–10.3)
GFR calc Af Amer: 21 mL/min/{1.73_m2} — ABNORMAL LOW (ref >59)
GFR calc non Af Amer: 18 mL/min/{1.73_m2} — ABNORMAL LOW (ref >59)
GLOBULIN, TOTAL: 2.6 g/dL (ref 1.5–4.5)
Glucose: 90 mg/dL (ref 65–99)
POTASSIUM: 4.7 mmol/L (ref 3.5–5.2)
Sodium: 139 mmol/L (ref 134–144)
Total Protein: 6.7 g/dL (ref 6.0–8.5)

## 2017-07-05 LAB — CBC WITH DIFFERENTIAL/PLATELET
Basophils Absolute: 0 10*3/uL (ref 0.0–0.2)
Basos: 1 %
EOS (ABSOLUTE): 0.1 10*3/uL (ref 0.0–0.4)
EOS: 1 %
Hematocrit: 31.6 % — ABNORMAL LOW (ref 34.0–46.6)
Hemoglobin: 10.9 g/dL — ABNORMAL LOW (ref 11.1–15.9)
Immature Grans (Abs): 0 10*3/uL (ref 0.0–0.1)
Immature Granulocytes: 0 %
LYMPHS ABS: 1.5 10*3/uL (ref 0.7–3.1)
Lymphs: 35 %
MCH: 31.6 pg (ref 26.6–33.0)
MCHC: 34.5 g/dL (ref 31.5–35.7)
MCV: 92 fL (ref 79–97)
MONOS ABS: 0.3 10*3/uL (ref 0.1–0.9)
Monocytes: 7 %
Neutrophils Absolute: 2.4 10*3/uL (ref 1.4–7.0)
Neutrophils: 56 %
Platelets: 258 10*3/uL (ref 150–450)
RBC: 3.45 x10E6/uL — ABNORMAL LOW (ref 3.77–5.28)
RDW: 14.7 % (ref 12.3–15.4)
WBC: 4.2 10*3/uL (ref 3.4–10.8)

## 2017-07-05 LAB — LIPID PANEL
CHOLESTEROL TOTAL: 153 mg/dL (ref 100–199)
Chol/HDL Ratio: 2 ratio (ref 0.0–4.4)
HDL: 77 mg/dL (ref >39)
LDL Calculated: 60 mg/dL (ref 0–99)
TRIGLYCERIDES: 79 mg/dL (ref 0–149)
VLDL Cholesterol Cal: 16 mg/dL (ref 5–40)

## 2017-07-05 LAB — TSH: TSH: 0.945 u[IU]/mL (ref 0.450–4.500)

## 2017-07-09 ENCOUNTER — Telehealth: Payer: Self-pay

## 2017-07-09 NOTE — Telephone Encounter (Signed)
Patient advised as below.  

## 2017-07-09 NOTE — Telephone Encounter (Signed)
Pt returned call ° °teri °

## 2017-07-09 NOTE — Telephone Encounter (Signed)
LMTCB

## 2017-07-09 NOTE — Telephone Encounter (Signed)
-----   Message from Mar Daring, [place] sent at 07/09/2017  8:27 AM EDT ----- All labs are normal with exception of hemoglobin which has decreased since last year. Recommend starting an iron supplement (ferrous sulfate 325mg ) once daily. Would like to recheck in 4 weeks. All other labs are normal and stable.

## 2017-07-17 DIAGNOSIS — I151 Hypertension secondary to other renal disorders: Secondary | ICD-10-CM | POA: Diagnosis not present

## 2017-07-17 DIAGNOSIS — N2889 Other specified disorders of kidney and ureter: Secondary | ICD-10-CM | POA: Diagnosis not present

## 2017-08-13 ENCOUNTER — Telehealth: Payer: Self-pay | Admitting: Physician Assistant

## 2017-08-13 DIAGNOSIS — D509 Iron deficiency anemia, unspecified: Secondary | ICD-10-CM

## 2017-08-13 NOTE — Telephone Encounter (Signed)
LM that lab requisition form is placed up front and she does not need to be fasting.  Thanks,  -Akai Dollard

## 2017-08-13 NOTE — Telephone Encounter (Signed)
Pt is coming back in this week for a repeat lab.  She was told to come back in a month  She wants to know if she is supposed to be fastin and will the lab sheet be up front when she gets here  [place]  thanks  teri

## 2017-08-21 DIAGNOSIS — D509 Iron deficiency anemia, unspecified: Secondary | ICD-10-CM | POA: Diagnosis not present

## 2017-08-22 ENCOUNTER — Telehealth: Payer: Self-pay

## 2017-08-22 LAB — IRON,TIBC AND FERRITIN PANEL
FERRITIN: 127 ng/mL (ref 15–150)
IRON SATURATION: 29 % (ref 15–55)
IRON: 74 ug/dL (ref 27–139)
Total Iron Binding Capacity: 254 ug/dL (ref 250–450)
UIBC: 180 ug/dL (ref 118–369)

## 2017-08-22 NOTE — Telephone Encounter (Signed)
She can discontinue for now

## 2017-08-22 NOTE — Telephone Encounter (Signed)
-----   Message from Mar Daring, PA-C sent at 08/22/2017  9:19 AM EDT ----- Iron levels are normal

## 2017-08-22 NOTE — Telephone Encounter (Signed)
Left message advising pt.   Thanks,   -Ella Golomb  

## 2017-08-22 NOTE — Telephone Encounter (Signed)
Pt advised.  She wanted to know if she should continue with the Iron supplements.   Thanks,   -Mickel Baas

## 2017-09-19 ENCOUNTER — Other Ambulatory Visit: Payer: Self-pay | Admitting: Physician Assistant

## 2017-09-19 DIAGNOSIS — M109 Gout, unspecified: Secondary | ICD-10-CM

## 2017-12-19 ENCOUNTER — Other Ambulatory Visit: Payer: Self-pay | Admitting: Physician Assistant

## 2017-12-19 DIAGNOSIS — R609 Edema, unspecified: Secondary | ICD-10-CM

## 2017-12-19 DIAGNOSIS — E039 Hypothyroidism, unspecified: Secondary | ICD-10-CM

## 2018-06-03 ENCOUNTER — Other Ambulatory Visit: Payer: Self-pay | Admitting: Physician Assistant

## 2018-06-03 DIAGNOSIS — E039 Hypothyroidism, unspecified: Secondary | ICD-10-CM

## 2018-07-08 ENCOUNTER — Other Ambulatory Visit: Payer: Self-pay

## 2018-07-08 ENCOUNTER — Encounter: Payer: Self-pay | Admitting: Physician Assistant

## 2018-07-08 ENCOUNTER — Ambulatory Visit (INDEPENDENT_AMBULATORY_CARE_PROVIDER_SITE_OTHER): Payer: Medicare Other

## 2018-07-08 DIAGNOSIS — Z0000 Encounter for general adult medical examination without abnormal findings: Secondary | ICD-10-CM

## 2018-07-08 NOTE — Patient Instructions (Addendum)
[name] , Thank you for taking time to come for your Medicare Wellness Visit. I appreciate your ongoing commitment to your health goals. Please review the following plan we discussed and let me know if I can assist you in the future.   Screening recommendations/referrals: Colonoscopy: No longer required.  Mammogram: No longer required.  Bone Density: Pt declined a DEXA order today.  Recommended yearly ophthalmology/optometry visit for glaucoma screening and checkup Recommended yearly dental visit for hygiene and checkup  Vaccinations: Influenza vaccine: Pt declines influenza vaccines.  Pneumococcal vaccine: Completed series Tdap vaccine: Up to date, due 10/2020 Shingles vaccine: Pt declines today.     Advanced directives: Currently on file.   Conditions/risks identified: Obesity- continue to work on diet and eat 3 small meals a day with two healthy snacks in between.   Next appointment: 09/09/18 @ 10 AM with Fenton Malling.   Preventive Care 75 Years and Older, Female Preventive care refers to lifestyle choices and visits with your health care provider that can promote health and wellness. What does preventive care include?  A yearly physical exam. This is also called an annual well check.  Dental exams once or twice a year.  Routine eye exams. Ask your health care provider how often you should have your eyes checked.  Personal lifestyle choices, including:  Daily care of your teeth and gums.  Regular physical activity.  Eating a healthy diet.  Avoiding tobacco and drug use.  Limiting alcohol use.  Practicing safe sex.  Taking low-dose aspirin every day.  Taking vitamin and mineral supplements as recommended by your health care provider. What happens during an annual well check? The services and screenings done by your health care provider during your annual well check will depend on your age, overall health, lifestyle risk factors, and family history of  disease. Counseling  Your health care provider may ask you questions about your:  Alcohol use.  Tobacco use.  Drug use.  Emotional well-being.  Home and relationship well-being.  Sexual activity.  Eating habits.  History of falls.  Memory and ability to understand (cognition).  Work and work Statistician.  Reproductive health. Screening  You may have the following tests or measurements:  Height, weight, and BMI.  Blood pressure.  Lipid and cholesterol levels. These may be checked every 5 years, or more frequently if you are over 5 years old.  Skin check.  Lung cancer screening. You may have this screening every year starting at age 39 if you have a 30-pack-year history of smoking and currently smoke or have quit within the past 15 years.  Fecal occult blood test (FOBT) of the stool. You may have this test every year starting at age 58.  Flexible sigmoidoscopy or colonoscopy. You may have a sigmoidoscopy every 5 years or a colonoscopy every 10 years starting at age 38.  Hepatitis C blood test.  Hepatitis B blood test.  Sexually transmitted disease (STD) testing.  Diabetes screening. This is done by checking your blood sugar (glucose) after you have not eaten for a while (fasting). You may have this done every 1-3 years.  Bone density scan. This is done to screen for osteoporosis. You may have this done starting at age 59.  Mammogram. This may be done every 1-2 years. Talk to your health care provider about how often you should have regular mammograms. Talk with your health care provider about your test results, treatment options, and if necessary, the need for more tests. Vaccines  Your  health care provider may recommend certain vaccines, such as:  Influenza vaccine. This is recommended every year.  Tetanus, diphtheria, and acellular pertussis (Tdap, Td) vaccine. You may need a Td booster every 10 years.  Zoster vaccine. You may need this after age 45.   Pneumococcal 13-valent conjugate (PCV13) vaccine. One dose is recommended after age 42.  Pneumococcal polysaccharide (PPSV23) vaccine. One dose is recommended after age 89. Talk to your health care provider about which screenings and vaccines you need and how often you need them. This information is not intended to replace advice given to you by your health care provider. Make sure you discuss any questions you have with your health care provider. Document Released: 02/19/2015 Document Revised: 10/13/2015 Document Reviewed: 11/24/2014 Elsevier Interactive Patient Education  2017 [place] Prevention in the Home Falls can cause injuries. They can happen to people of all ages. There are many things you can do to make your home safe and to help prevent falls. What can I do on the outside of my home?  Regularly fix the edges of walkways and driveways and fix any cracks.  Remove anything that might make you trip as you walk through a door, such as a raised step or threshold.  Trim any bushes or trees on the path to your home.  Use bright outdoor lighting.  Clear any walking paths of anything that might make someone trip, such as rocks or tools.  Regularly check to see if handrails are loose or broken. Make sure that both sides of any steps have handrails.  Any raised decks and porches should have guardrails on the edges.  Have any leaves, snow, or ice cleared regularly.  Use sand or salt on walking paths during winter.  Clean up any spills in your garage right away. This includes oil or grease spills. What can I do in the bathroom?  Use night lights.  Install grab bars by the toilet and in the tub and shower. Do not use towel bars as grab bars.  Use non-skid mats or decals in the tub or shower.  If you need to sit down in the shower, use a plastic, non-slip stool.  Keep the floor dry. Clean up any water that spills on the floor as soon as it happens.  Remove soap  buildup in the tub or shower regularly.  Attach bath mats securely with double-sided non-slip rug tape.  Do not have throw rugs and other things on the floor that can make you trip. What can I do in the bedroom?  Use night lights.  Make sure that you have a light by your bed that is easy to reach.  Do not use any sheets or blankets that are too big for your bed. They should not hang down onto the floor.  Have a firm chair that has side arms. You can use this for support while you get dressed.  Do not have throw rugs and other things on the floor that can make you trip. What can I do in the kitchen?  Clean up any spills right away.  Avoid walking on wet floors.  Keep items that you use a lot in easy-to-reach places.  If you need to reach something above you, use a strong step stool that has a grab bar.  Keep electrical cords out of the way.  Do not use floor polish or wax that makes floors slippery. If you must use wax, use non-skid floor wax.  Do not  have throw rugs and other things on the floor that can make you trip. What can I do with my stairs?  Do not leave any items on the stairs.  Make sure that there are handrails on both sides of the stairs and use them. Fix handrails that are broken or loose. Make sure that handrails are as long as the stairways.  Check any carpeting to make sure that it is firmly attached to the stairs. Fix any carpet that is loose or worn.  Avoid having throw rugs at the top or bottom of the stairs. If you do have throw rugs, attach them to the floor with carpet tape.  Make sure that you have a light switch at the top of the stairs and the bottom of the stairs. If you do not have them, ask someone to add them for you. What else can I do to help prevent falls?  Wear shoes that:  Do not have high heels.  Have rubber bottoms.  Are comfortable and fit you well.  Are closed at the toe. Do not wear sandals.  If you use a stepladder:  Make  sure that it is fully opened. Do not climb a closed stepladder.  Make sure that both sides of the stepladder are locked into place.  Ask someone to hold it for you, if possible.  Clearly mark and make sure that you can see:  Any grab bars or handrails.  First and last steps.  Where the edge of each step is.  Use tools that help you move around (mobility aids) if they are needed. These include:  Canes.  Walkers.  Scooters.  Crutches.  Turn on the lights when you go into a dark area. Replace any light bulbs as soon as they burn out.  Set up your furniture so you have a clear path. Avoid moving your furniture around.  If any of your floors are uneven, fix them.  If there are any pets around you, be aware of where they are.  Review your medicines with your doctor. Some medicines can make you feel dizzy. This can increase your chance of falling. Ask your doctor what other things that you can do to help prevent falls. This information is not intended to replace advice given to you by your health care provider. Make sure you discuss any questions you have with your health care provider. Document Released: 11/19/2008 Document Revised: 07/01/2015 Document Reviewed: 02/27/2014 Elsevier Interactive Patient Education  2017 [employer].

## 2018-07-08 NOTE — Progress Notes (Signed)
Subjective:   Sherry Long is a 78 y.o. female who presents for Medicare Annual (Subsequent) preventive examination.    This visit is being conducted through telemedicine due to the COVID-19 pandemic. This patient has given me verbal consent via doximity to conduct this visit, patient states they are participating from their home address. Some vital signs may be absent or patient reported.    Patient identification: identified by name, DOB, and current address  Review of Systems:  N/A  Cardiac Risk Factors include: advanced age (>76men, >34 women);hypertension;obesity (BMI >30kg/m2)     Objective:     Vitals: There were no vitals taken for this visit.  There is no height or weight on file to calculate BMI. Unable to obtain vitals due to visit being conducted via telephonically.   Advanced Directives 07/08/2018 07/04/2017 07/06/2016 06/29/2016 06/23/2015  Does Patient Have a Medical Advance Directive? Yes No No No No  Type of Paramedic of [place];Living will - - - -  Copy of [place] in Chart? Yes - validated most recent copy scanned in chart (See row information) - - - -  Would patient like information on creating a medical advance directive? - Yes (MAU/Ambulatory/Procedural Areas - Information given) - No - Patient declined -    Tobacco Social History   Tobacco Use  Smoking Status Former Smoker  . Types: Cigarettes  . Last attempt to quit: 02/06/1976  . Years since quitting: 42.4  Smokeless Tobacco Never Used  Tobacco Comment   quit in 20's     Counseling given: Not Answered Comment: quit in 20's   Clinical Intake:  Pre-visit preparation completed: Yes  Pain : No/denies pain Pain Score: 0-No pain     Nutritional Status: BMI > 30  Obese Nutritional Risks: None Diabetes: No  How often do you need to have someone help you when you read instructions, pamphlets, or other written materials from your doctor or pharmacy?:  1 - Never  Interpreter Needed?: No  Information entered by :: [facility], LPN  Past Medical History:  Diagnosis Date  . Hypertension   . Morbid obesity ([place])    Past Surgical History:  Procedure Laterality Date  . TONSILLECTOMY AND ADENOIDECTOMY     Family History  Problem Relation Age of Onset  . Heart disease Mother   . Alzheimer's disease Mother   . Stroke Father   . Hypertension Sister   . Heart disease Sister   . Heart disease Maternal Grandmother   . Stroke Maternal Grandmother   . Colon cancer Other    Social History   Socioeconomic History  . Marital status: Widowed    Spouse name: Not on file  . Number of children: 1  . Years of education: Not on file  . Highest education level: Associate degree: occupational, Hotel manager, or vocational program  Occupational History  . Occupation: retired  Scientific laboratory technician  . Financial resource strain: Not hard at all  . Food insecurity:    Worry: Never true    Inability: Never true  . Transportation needs:    Medical: No    Non-medical: No  Tobacco Use  . Smoking status: Former Smoker    Types: Cigarettes    Last attempt to quit: 02/06/1976    Years since quitting: 42.4  . Smokeless tobacco: Never Used  . Tobacco comment: quit in 20's  Substance and Sexual Activity  . Alcohol use: No  . Drug use: No  . Sexual activity:  Not on file  Lifestyle  . Physical activity:    Days per week: 0 days    Minutes per session: 0 min  . Stress: Only a little  Relationships  . Social connections:    Talks on phone: Patient refused    Gets together: Patient refused    Attends religious service: Patient refused    Active member of club or organization: Patient refused    Attends meetings of clubs or organizations: Patient refused    Relationship status: Patient refused  Other Topics Concern  . Not on file  Social History Narrative   1 son - deceased    Outpatient Encounter Medications as of 07/08/2018  Medication Sig  .  acetaminophen (TYLENOL) 500 MG tablet Take 500 mg by mouth as needed.  Marland Kitchen allopurinol (ZYLOPRIM) 100 MG tablet TAKE 2 TABLETS BY MOUTH EVERY DAY  . aspirin 81 MG tablet Take 81 mg by mouth daily.   . furosemide (LASIX) 40 MG tablet TAKE 1 TABLET BY MOUTH EVERY DAY  . Iron-Vitamins (GERITOL COMPLETE PO) Take by mouth 2 (two) times a week.  Marland Kitchen ketoconazole (NIZORAL) 2 % cream Apply 1 application topically daily as needed.   Marland Kitchen levothyroxine (SYNTHROID) 137 MCG tablet TAKE 1 TABLET BY MOUTH DAILY  . metoprolol tartrate (LOPRESSOR) 25 MG tablet TAKE 1 TABLET BY MOUTH TWICE A DAY (Patient taking differently: Take 75 mg by mouth daily. )   No facility-administered encounter medications on file as of 07/08/2018.     Activities of Daily Living In your present state of health, do you have any difficulty performing the following activities: 07/08/2018  Hearing? N  Vision? N  Comment Wears eye glasses daily.   Difficulty concentrating or making decisions? N  Walking or climbing stairs? Y  Comment Avoids steps due to balance.   Dressing or bathing? N  Doing errands, shopping? N  Preparing Food and eating ? N  Using the Toilet? N  In the past six months, have you accidently leaked urine? N  Do you have problems with loss of bowel control? N  Managing your Medications? N  Managing your Finances? N  Housekeeping or managing your Housekeeping? N  Some recent data might be hidden    Patient Care Team: Mar Daring, PA-C as PCP - General (Family Medicine) Kshirsagar, Thomasena Edis, MD as Referring Physician (Nephrology)    Assessment:   This is a routine wellness examination for Sherry Long.  Exercise Activities and Dietary recommendations Current Exercise Habits: The patient does not participate in regular exercise at present, Exercise limited by: Other - see comments(arthritis pains)  Goals    . Cut out extra servings     Recommend to continue cutting out the extra snacking and avoiding junk  food.     . Reduce portion size     Recommend decreasing daily portion sizes.       Fall Risk: Fall Risk  07/08/2018 07/04/2017 06/29/2016 06/23/2015  Falls in the past year? 0 No No No    FALL RISK PREVENTION PERTAINING TO THE HOME:  Any stairs in or around the home? Yes  If so, are there any without handrails? Yes   Home free of loose throw rugs in walkways, pet beds, electrical cords, etc? Yes  Adequate lighting in your home to reduce risk of falls? Yes   ASSISTIVE DEVICES UTILIZED TO PREVENT FALLS:  Life alert? No  Use of a cane, walker or w/c? Yes  Grab bars in the bathroom? Yes  Shower chair or bench in shower? No  Elevated toilet seat or a handicapped toilet? Yes    TIMED UP AND GO:  Was the test performed? No .    Depression Screen PHQ 2/9 Scores 07/08/2018 07/04/2017 07/04/2017 06/29/2016  PHQ - 2 Score 0 1 1 0  PHQ- 9 Score - 5 - 3     Cognitive Function     6CIT Screen 07/08/2018 06/29/2016  What Year? 0 points 0 points  What month? 0 points 0 points  What time? 0 points 0 points  Count back from 20 0 points 0 points  Months in reverse 0 points 0 points  Repeat phrase 0 points 0 points  Total Score 0 0    Immunization History  Administered Date(s) Administered  . Pneumococcal Conjugate-13 06/22/2014  . Pneumococcal Polysaccharide-23 06/23/2015  . Td 08/09/1998    Qualifies for Shingles Vaccine? Yes . Due for Shingrix. Education has been provided regarding the importance of this vaccine. Pt has been advised to call insurance company to determine out of pocket expense. Advised may also receive vaccine at local pharmacy or Health Dept. Verbalized acceptance and understanding.  Tdap: Up to date  Flu Vaccine: Due for Flu vaccine. Does the patient want to receive this vaccine today?  No . Advised may receive this vaccine at local pharmacy or Health Dept. Aware to provide a copy of the vaccination record if obtained from local pharmacy or Health Dept.  Verbalized acceptance and understanding.  Pneumococcal Vaccine: Up to date  Screening Tests Health Maintenance  Topic Date Due  . DEXA SCAN  05/22/2016  . INFLUENZA VACCINE  09/07/2018  . TETANUS/TDAP  10/23/2020  . PNA vac Low Risk Adult  Completed    Cancer Screenings:  Colorectal Screening: No longer required.   Mammogram: No longer required.   Bone Density: Completed 05/23/11. Results reflect  OSTEOPENIA. Repeat every 5 years. Pt declined an order for a new scan.   Lung Cancer Screening: (Low Dose CT Chest recommended if Age 16-80 years, 30 pack-year currently smoking OR have quit w/in 15years.) does not qualify.   Additional Screening:  Vision Screening: Recommended annual ophthalmology exams for early detection of glaucoma and other disorders of the eye.  Dental Screening: Recommended annual dental exams for proper oral hygiene  Community Resource Referral:  CRR required this visit?  No       Plan:  I have personally reviewed and addressed the Medicare Annual Wellness questionnaire and have noted the following in the patient's chart:  A. Medical and social history B. Use of alcohol, tobacco or illicit drugs  C. Current medications and supplements D. Functional ability and status E.  Nutritional status F.  Physical activity G. Advance directives H. List of other physicians I.  Hospitalizations, surgeries, and ER visits in previous 12 months J.  [place] such as hearing and vision if needed, cognitive and depression L. Referrals and appointments   In addition, I have reviewed and discussed with patient certain preventive protocols, quality metrics, and best practice recommendations. A written personalized care plan for preventive services as well as general preventive health recommendations were provided to patient. Nurse Health Advisor  Signed,    Deshan Hemmelgarn [place], [place]  02/10/7827 Nurse Health Advisor   Nurse Notes: Pt declined the DEXA order  today.

## 2018-07-30 DIAGNOSIS — I129 Hypertensive chronic kidney disease with stage 1 through stage 4 chronic kidney disease, or unspecified chronic kidney disease: Secondary | ICD-10-CM | POA: Diagnosis not present

## 2018-07-30 DIAGNOSIS — N184 Chronic kidney disease, stage 4 (severe): Secondary | ICD-10-CM | POA: Diagnosis not present

## 2018-08-05 ENCOUNTER — Other Ambulatory Visit: Payer: Self-pay | Admitting: Physician Assistant

## 2018-08-05 DIAGNOSIS — M109 Gout, unspecified: Secondary | ICD-10-CM

## 2018-08-06 ENCOUNTER — Other Ambulatory Visit: Payer: Self-pay | Admitting: Physician Assistant

## 2018-08-06 DIAGNOSIS — I10 Essential (primary) hypertension: Secondary | ICD-10-CM

## 2018-08-06 DIAGNOSIS — R609 Edema, unspecified: Secondary | ICD-10-CM

## 2018-09-09 ENCOUNTER — Ambulatory Visit (INDEPENDENT_AMBULATORY_CARE_PROVIDER_SITE_OTHER): Payer: Medicare Other | Admitting: Physician Assistant

## 2018-09-09 ENCOUNTER — Other Ambulatory Visit: Payer: Self-pay

## 2018-09-09 ENCOUNTER — Encounter: Payer: Self-pay | Admitting: Physician Assistant

## 2018-09-09 VITALS — BP 166/86 | HR 61 | Temp 97.9°F | Resp 16 | Ht 65.0 in | Wt 277.0 lb

## 2018-09-09 DIAGNOSIS — N184 Chronic kidney disease, stage 4 (severe): Secondary | ICD-10-CM | POA: Diagnosis not present

## 2018-09-09 DIAGNOSIS — I872 Venous insufficiency (chronic) (peripheral): Secondary | ICD-10-CM

## 2018-09-09 DIAGNOSIS — Z0000 Encounter for general adult medical examination without abnormal findings: Secondary | ICD-10-CM

## 2018-09-09 DIAGNOSIS — M79606 Pain in leg, unspecified: Secondary | ICD-10-CM | POA: Diagnosis not present

## 2018-09-09 DIAGNOSIS — M5431 Sciatica, right side: Secondary | ICD-10-CM

## 2018-09-09 DIAGNOSIS — E039 Hypothyroidism, unspecified: Secondary | ICD-10-CM

## 2018-09-09 DIAGNOSIS — Z6841 Body Mass Index (BMI) 40.0 and over, adult: Secondary | ICD-10-CM

## 2018-09-09 DIAGNOSIS — M7989 Other specified soft tissue disorders: Secondary | ICD-10-CM

## 2018-09-09 MED ORDER — CYCLOBENZAPRINE HCL 5 MG PO TABS: 5.0000 mg | ORAL_TABLET | Freq: Three times a day (TID) | ORAL | 1 refills | Status: DC | PRN

## 2018-09-09 NOTE — Progress Notes (Signed)
Patient: Sherry Long, Female    DOB: 03-May-1940, 78 y.o.   MRN: [id number] Visit Date: 09/09/2018  Today's Provider: Mar Daring, PA-C   Chief Complaint  Patient presents with  . Annual Exam   Subjective:   Patient had AW[facility] with [facility] 07/08/2018.   Complete Physical Sherry Long is a 78 y.o. female. She feels fairly well. She reports exercising none. She reports she is sleeping well. ----------------------------------------------  Review of Systems  Constitutional: Negative.   HENT: Negative.   Eyes: Negative.   Respiratory: Negative.   Cardiovascular: Positive for leg swelling.  Gastrointestinal: Negative.   Endocrine: Negative.   Genitourinary: Negative.   Musculoskeletal: Positive for arthralgias and back pain.  Skin: Positive for rash.  Allergic/Immunologic: Negative.   Neurological: Negative.   Hematological: Negative.   Psychiatric/Behavioral: Negative.     Social History   Socioeconomic History  . Marital status: Widowed    Spouse name: Not on file  . Number of children: 1  . Years of education: Not on file  . Highest education level: Associate degree: occupational, Hotel manager, or vocational program  Occupational History  . Occupation: retired  Scientific laboratory technician  . Financial resource strain: Not hard at all  . Food insecurity    Worry: Never true    Inability: Never true  . Transportation needs    Medical: No    Non-medical: No  Tobacco Use  . Smoking status: Former Smoker    Types: Cigarettes    Quit date: 02/06/1976    Years since quitting: 42.6  . Smokeless tobacco: Never Used  . Tobacco comment: quit in 20's  Substance and Sexual Activity  . Alcohol use: No  . Drug use: No  . Sexual activity: Not on file  Lifestyle  . Physical activity    Days per week: 0 days    Minutes per session: 0 min  . Stress: Only a little  Relationships  . Social Herbalist on phone: Patient refused    Gets together: Patient refused   Attends religious service: Patient refused    Active member of club or organization: Patient refused    Attends meetings of clubs or organizations: Patient refused    Relationship status: Patient refused  . Intimate partner violence    Fear of current or ex partner: Patient refused    Emotionally abused: Patient refused    Physically abused: Patient refused    Forced sexual activity: Patient refused  Other Topics Concern  . Not on file  Social History Narrative   1 son - deceased    Past Medical History:  Diagnosis Date  . Hypertension   . Morbid obesity [facility])      Patient Active Problem List   Diagnosis Date Noted  . Class 3 severe obesity due to excess calories with serious comorbidity and body mass index (BMI) of 45.0 to 49.9 in adult ([place]) 07/06/2016  . Arthritis, degenerative 06/14/2015  . Accumulation of fluid in tissues 05/11/2015  . Gout 01/04/2015  . Adult hypothyroidism 12/11/2014  . Hypertension 07/20/2014  . Chronic kidney disease, stage IV (severe) ([place]) 07/15/2010    Past Surgical History:  Procedure Laterality Date  . TONSILLECTOMY AND ADENOIDECTOMY      Her family history includes Alzheimer's disease in her mother; Colon cancer in an other family member; Heart disease in her maternal grandmother, mother, and sister; Hypertension in her sister; Stroke in her father and maternal grandmother.  Current Outpatient Medications:  .  acetaminophen (TYLENOL) 500 MG tablet, Take 500 mg by mouth as needed., Disp: , Rfl:  .  allopurinol (ZYLOPRIM) 100 MG tablet, TAKE 2 TABLETS BY MOUTH EVERY DAY, Disp: 180 tablet, Rfl: 1 .  aspirin 81 MG tablet, Take 81 mg by mouth daily. , Disp: , Rfl:  .  furosemide (LASIX) 40 MG tablet, TAKE 1 TABLET BY MOUTH DAILY, Disp: 90 tablet, Rfl: 1 .  Iron-Vitamins (GERITOL COMPLETE PO), Take by mouth 2 (two) times a week., Disp: , Rfl:  .  ketoconazole (NIZORAL) 2 % cream, Apply 1 application topically daily as needed. , Disp: , Rfl:  .   levothyroxine (SYNTHROID) 137 MCG tablet, TAKE 1 TABLET BY MOUTH DAILY, Disp: 90 tablet, Rfl: 1 .  metoprolol tartrate (LOPRESSOR) 25 MG tablet, TAKE 1 TABLET (25 MG) BY MOUTH IN THE MORNING AND TAKE 2 TABLETS (50 MG) BY MOUTH IN THE EVENING, Disp: 270 tablet, Rfl: 1  Patient Care Team: Mar Daring, PA-C as PCP - General (Family Medicine) Kshirsagar, Thomasena Edis, MD as Referring Physician (Nephrology)     Objective:    Vitals: BP (!) 166/86 (BP Location: Right Wrist, Patient Position: Sitting, Cuff Size: Large)   Pulse 61   Temp 97.9 F (36.6 C) (Oral)   Resp 16   Ht 5\' 5"  (1.651 m)   Wt 277 lb (125.6 kg)   SpO2 99%   BMI 46.10 kg/m   Physical Exam Vitals signs reviewed.  Constitutional:      General: She is not in acute distress.    Appearance: Normal appearance. She is well-developed. She is obese. She is not ill-appearing or diaphoretic.  HENT:     Head: Normocephalic and atraumatic.     Right Ear: Tympanic membrane, ear canal and external ear normal.     Left Ear: Tympanic membrane, ear canal and external ear normal.     Nose: Nose normal.     Mouth/Throat:     Mouth: Mucous membranes are moist.     Pharynx: No oropharyngeal exudate.  Eyes:     General: No scleral icterus.       Right eye: No discharge.        Left eye: No discharge.     Extraocular Movements: Extraocular movements intact.     Conjunctiva/sclera: Conjunctivae normal.     Pupils: Pupils are equal, round, and reactive to light.  Neck:     Musculoskeletal: Normal range of motion and neck supple.     Thyroid: No thyromegaly.     Vascular: Carotid bruit (bilateral) present. No JVD.     Trachea: No tracheal deviation.  Cardiovascular:     Rate and Rhythm: Normal rate and regular rhythm.     Pulses: Normal pulses.     Heart sounds: Normal heart sounds. No murmur. No friction rub. No gallop.   Pulmonary:     Effort: Pulmonary effort is normal. No respiratory distress.     Breath sounds: Normal  breath sounds. No wheezing or rales.  Chest:     Chest wall: No tenderness.  Abdominal:     General: Abdomen is protuberant. Bowel sounds are normal. There is no distension.     Palpations: Abdomen is soft. There is no mass.     Tenderness: There is no abdominal tenderness. There is no guarding or rebound.  Musculoskeletal: Normal range of motion.        General: No tenderness.     Right lower leg:  Edema (2-3+ pitting to just below knee) present.     Left lower leg: Edema (2-3+ pitting to just below knee) present.  Lymphadenopathy:     Cervical: No cervical adenopathy.  Skin:    General: Skin is warm and dry.     Capillary Refill: Capillary refill takes less than 2 seconds.     Findings: Rash (venous stasis dermatitis on lower extremities with L>R) present.  Neurological:     General: No focal deficit present.     Mental Status: She is alert and oriented to person, place, and time. Mental status is at baseline.     Cranial Nerves: No cranial nerve deficit.     Motor: No weakness.     Coordination: Coordination normal.     Gait: Gait abnormal (ambulates with walker).  Psychiatric:        Mood and Affect: Mood normal.        Behavior: Behavior normal.        Thought Content: Thought content normal.        Judgment: Judgment normal.     Activities of Daily Living In your present state of health, do you have any difficulty performing the following activities: 07/08/2018  Hearing? N  Vision? N  Comment Wears eye glasses daily.   Difficulty concentrating or making decisions? N  Walking or climbing stairs? Y  Comment Avoids steps due to balance.   Dressing or bathing? N  Doing errands, shopping? N  Preparing Food and eating ? N  Using the Toilet? N  In the past six months, have you accidently leaked urine? N  Do you have problems with loss of bowel control? N  Managing your Medications? N  Managing your Finances? N  Housekeeping or managing your Housekeeping? N  Some recent  data might be hidden    Fall Risk Assessment Fall Risk  07/08/2018 07/04/2017 06/29/2016 06/23/2015  Falls in the past year? 0 No No No     Depression Screen PHQ 2/9 Scores 07/08/2018 07/04/2017 07/04/2017 06/29/2016  PHQ - 2 Score 0 1 1 0  PHQ- 9 Score - 5 - 3    6CIT Screen 07/08/2018  What Year? 0 points  What month? 0 points  What time? 0 points  Count back from 20 0 points  Months in reverse 0 points  Repeat phrase 0 points  Total Score 0       Assessment & Plan:    Annual Physical Reviewed patient's Family Medical History Reviewed and updated list of patient's medical providers Assessment of cognitive impairment was done Assessed patient's functional ability Established a written schedule for health screening [place] Completed and Reviewed  Exercise Activities and Dietary recommendations Goals    . Cut out extra servings     Recommend to continue cutting out the extra snacking and avoiding junk food.     . Reduce portion size     Recommend decreasing daily portion sizes.       Immunization History  Administered Date(s) Administered  . Pneumococcal Conjugate-13 06/22/2014  . Pneumococcal Polysaccharide-23 06/23/2015  . Td 08/09/1998    Health Maintenance  Topic Date Due  . DEXA SCAN  05/22/2016  . INFLUENZA VACCINE  09/07/2018  . TETANUS/TDAP  10/23/2020  . PNA vac Low Risk Adult  Completed     Discussed health benefits of physical activity, and encouraged her to engage in regular exercise appropriate for her age and condition.    1. Annual physical exam Normal  physical exam today. Will check labs as below and f/u pending lab results. If labs are stable and WNL she will not need to have these rechecked for one year at her next annual physical exam. She is to call the office in the meantime if she has any acute issue, questions or concerns. - CBC w/Diff/Platelet - Comprehensive Metabolic Panel (CMET) - TSH - Lipid Profile - HgB A1c   2. Class 3 severe obesity due to excess calories with serious comorbidity and body mass index (BMI) of 45.0 to 49.9 in adult [facility]) Counseled patient on healthy lifestyle modifications including dieting and exercise.  She has lost 10 pounds in the last year.  - CBC w/Diff/Platelet - Comprehensive Metabolic Panel (CMET) - Lipid Profile - HgB A1c  3. Chronic kidney disease, stage IV (severe) ([place]) Followed by Nephrology. Will check labs as below and f/u pending results. - CBC w/Diff/Platelet - Comprehensive Metabolic Panel (CMET) - Lipid Profile - HgB A1c - Phosphorus  4. Adult hypothyroidism Stable. Continue levothyroxine 137 mcg. Will check labs as below and f/u pending results. - TSH  5. Right sided sciatica Flared currently. Will send in flexeril for prn use.  - cyclobenzaprine (FLEXERIL) 5 MG tablet; Take 1 tablet (5 mg total) by mouth 3 (three) times daily as needed for muscle spasms.  Dispense: 30 tablet; Refill: 1  6. Venous stasis dermatitis of both lower extremities Discussed referral to vein clinic, using compression stockings, keeping legs elevated, using good moisturizer, and considering a lymphedema pump. She declines at this time and will continue moisturizing and elevating as she can. Call if worsening.   7. Pain and swelling of lower extremity, unspecified laterality See above medical treatment plan.  --------------------------------------------------------     Mar Daring, PA-C  [facility]

## 2018-09-09 NOTE — Patient Instructions (Signed)

## 2018-09-10 ENCOUNTER — Telehealth: Payer: Self-pay

## 2018-09-10 LAB — CBC WITH DIFFERENTIAL/PLATELET
Basophils Absolute: 0 10*3/uL (ref 0.0–0.2)
Basos: 0 %
EOS (ABSOLUTE): 0 10*3/uL (ref 0.0–0.4)
Eos: 1 %
Hematocrit: 32.7 % — ABNORMAL LOW (ref 34.0–46.6)
Hemoglobin: 10.9 g/dL — ABNORMAL LOW (ref 11.1–15.9)
Immature Grans (Abs): 0 10*3/uL (ref 0.0–0.1)
Immature Granulocytes: 0 %
Lymphocytes Absolute: 1.2 10*3/uL (ref 0.7–3.1)
Lymphs: 28 %
MCH: 31.3 pg (ref 26.6–33.0)
MCHC: 33.3 g/dL (ref 31.5–35.7)
MCV: 94 fL (ref 79–97)
Monocytes Absolute: 0.3 10*3/uL (ref 0.1–0.9)
Monocytes: 7 %
Neutrophils Absolute: 2.7 10*3/uL (ref 1.4–7.0)
Neutrophils: 64 %
Platelets: 247 10*3/uL (ref 150–450)
RBC: 3.48 x10E6/uL — ABNORMAL LOW (ref 3.77–5.28)
RDW: 13.7 % (ref 11.7–15.4)
WBC: 4.2 10*3/uL (ref 3.4–10.8)

## 2018-09-10 LAB — COMPREHENSIVE METABOLIC PANEL
ALT: 12 IU/L (ref 0–32)
AST: 20 IU/L (ref 0–40)
Albumin/Globulin Ratio: 1.5 (ref 1.2–2.2)
Albumin: 4.1 g/dL (ref 3.7–4.7)
Alkaline Phosphatase: 142 IU/L — ABNORMAL HIGH (ref 39–117)
BUN/Creatinine Ratio: 10 — ABNORMAL LOW (ref 12–28)
BUN: 25 mg/dL (ref 8–27)
Bilirubin Total: 0.3 mg/dL (ref 0.0–1.2)
CO2: 20 mmol/L (ref 20–29)
Calcium: 10 mg/dL (ref 8.7–10.3)
Chloride: 100 mmol/L (ref 96–106)
Creatinine, Ser: 2.4 mg/dL — ABNORMAL HIGH (ref 0.57–1.00)
GFR calc Af Amer: 22 mL/min/{1.73_m2} — ABNORMAL LOW (ref >59)
GFR calc non Af Amer: 19 mL/min/{1.73_m2} — ABNORMAL LOW (ref >59)
Globulin, Total: 2.8 g/dL (ref 1.5–4.5)
Glucose: 90 mg/dL (ref 65–99)
Potassium: 4.6 mmol/L (ref 3.5–5.2)
Sodium: 137 mmol/L (ref 134–144)
Total Protein: 6.9 g/dL (ref 6.0–8.5)

## 2018-09-10 LAB — LIPID PANEL
Chol/HDL Ratio: 2.1 ratio (ref 0.0–4.4)
Cholesterol, Total: 149 mg/dL (ref 100–199)
HDL: 72 mg/dL (ref >39)
LDL Calculated: 62 mg/dL (ref 0–99)
Triglycerides: 75 mg/dL (ref 0–149)
VLDL Cholesterol Cal: 15 mg/dL (ref 5–40)

## 2018-09-10 LAB — PHOSPHORUS: Phosphorus: 3.7 mg/dL (ref 3.0–4.3)

## 2018-09-10 LAB — TSH: TSH: 0.975 u[IU]/mL (ref 0.450–4.500)

## 2018-09-10 LAB — HEMOGLOBIN A1C
Est. average glucose Bld gHb Est-mCnc: 105 mg/dL
Hgb A1c MFr Bld: 5.3 % (ref 4.8–5.6)

## 2018-09-10 NOTE — Telephone Encounter (Signed)
-----   Message from Mar Daring, PA-C sent at 09/10/2018  2:00 PM EDT ----- Hemoglobin is stable at 10.9. Kidney function is stable. Creatinine is 2.40. Liver enzymes are normal. Sodium, potassium and calcium are normal. Thyroid is normal. Cholesterol is normal. A1c/sugar are normal. Phosphorus are normal.   Can we please also fax to Dr. Kathrynn Ducking, [facility] Phone: [phone]

## 2018-09-10 NOTE — Telephone Encounter (Signed)
Patient was advised and labs faxed to nephrology.

## 2018-09-10 NOTE — Telephone Encounter (Signed)
LVMTRC 

## 2018-12-09 ENCOUNTER — Other Ambulatory Visit: Payer: Self-pay | Admitting: Physician Assistant

## 2018-12-09 DIAGNOSIS — E039 Hypothyroidism, unspecified: Secondary | ICD-10-CM

## 2018-12-09 NOTE — Telephone Encounter (Signed)
L.O.V. was on 09/09/2018.

## 2019-01-24 ENCOUNTER — Other Ambulatory Visit: Payer: Self-pay | Admitting: Physician Assistant

## 2019-01-24 DIAGNOSIS — I10 Essential (primary) hypertension: Secondary | ICD-10-CM

## 2019-06-04 ENCOUNTER — Other Ambulatory Visit: Payer: Self-pay | Admitting: Physician Assistant

## 2019-06-04 DIAGNOSIS — R609 Edema, unspecified: Secondary | ICD-10-CM

## 2019-06-04 DIAGNOSIS — E039 Hypothyroidism, unspecified: Secondary | ICD-10-CM

## 2019-06-04 NOTE — Telephone Encounter (Signed)
Scheduled appointment 6/2- courtesy RF given

## 2019-06-17 DIAGNOSIS — Z7689 Persons encountering health services in other specified circumstances: Secondary | ICD-10-CM | POA: Diagnosis not present

## 2019-06-19 DIAGNOSIS — I151 Hypertension secondary to other renal disorders: Secondary | ICD-10-CM | POA: Diagnosis not present

## 2019-06-19 DIAGNOSIS — N184 Chronic kidney disease, stage 4 (severe): Secondary | ICD-10-CM | POA: Diagnosis not present

## 2019-06-19 DIAGNOSIS — N2889 Other specified disorders of kidney and ureter: Secondary | ICD-10-CM | POA: Diagnosis not present

## 2019-07-08 NOTE — Progress Notes (Signed)
Subjective:   Sherry Long is a 79 y.o. female who presents for Medicare Annual (Subsequent) preventive examination.  I connected with Fayrene Helper today by telephone and verified that I am speaking with the correct person using two identifiers. Location patient: home Location provider: work Persons participating in the virtual visit: patient, provider.   I discussed the limitations, risks, security and privacy concerns of performing an evaluation and management service by telephone and the availability of in person appointments. I also discussed with the patient that there may be a patient responsible charge related to this service. The patient expressed understanding and verbally consented to this telephonic visit.    Interactive audio and video telecommunications were attempted between this provider and patient, however failed, due to patient having technical difficulties OR patient did not have access to video capability.  We continued and completed visit with audio only.   Review of Systems:  N/A  Cardiac Risk Factors include: advanced age (>77men, >24 women);obesity (BMI >30kg/m2);hypertension     Objective:     Vitals: There were no vitals taken for this visit.  There is no height or weight on file to calculate BMI.  Advanced Directives 07/09/2019 07/08/2018 07/04/2017 07/06/2016 06/29/2016 06/23/2015  Does Patient Have a Medical Advance Directive? Yes Yes No No No No  Type of Paramedic of [place];Living will [place];Living will - - - -  Copy of [place] in Chart? Yes - validated most recent copy scanned in chart (See row information) Yes - validated most recent copy scanned in chart (See row information) - - - -  Would patient like information on creating a medical advance directive? - - Yes (MAU/Ambulatory/Procedural Areas - Information given) - No - Patient declined -    Tobacco Social History   Tobacco Use    Smoking Status Former Smoker  . Types: Cigarettes  . Quit date: 02/06/1976  . Years since quitting: 43.4  Smokeless Tobacco Never Used  Tobacco Comment   quit in 20's     Counseling given: Not Answered Comment: quit in 20's   Clinical Intake:  Pre-visit preparation completed: Yes  Pain : No/denies pain Pain Score: 0-No pain     Nutritional Risks: None Diabetes: No  How often do you need to have someone help you when you read instructions, pamphlets, or other written materials from your doctor or pharmacy?: 1 - Never  Interpreter Needed?: No  Information entered by :: [facility], LPN  Past Medical History:  Diagnosis Date  . Hypertension   . Morbid obesity ([place])    Past Surgical History:  Procedure Laterality Date  . TONSILLECTOMY AND ADENOIDECTOMY     Family History  Problem Relation Age of Onset  . Heart disease Mother   . Alzheimer's disease Mother   . Stroke Father   . Hypertension Sister   . Heart disease Sister   . Heart disease Maternal Grandmother   . Stroke Maternal Grandmother   . Colon cancer Other    Social History   Socioeconomic History  . Marital status: Widowed    Spouse name: Not on file  . Number of children: 1  . Years of education: Not on file  . Highest education level: Associate degree: occupational, Hotel manager, or vocational program  Occupational History  . Occupation: retired  Tobacco Use  . Smoking status: Former Smoker    Types: Cigarettes    Quit date: 02/06/1976    Years since quitting: 43.4  .  Smokeless tobacco: Never Used  . Tobacco comment: quit in 20's  Substance and Sexual Activity  . Alcohol use: No  . Drug use: No  . Sexual activity: Not on file  Other Topics Concern  . Not on file  Social History Narrative   1 son - deceased   Social Determinants of Health   Financial Resource Strain: Low Risk   . Difficulty of Paying Living Expenses: Not hard at all  Food Insecurity: No Food Insecurity  . Worried  About Charity fundraiser in the Last Year: Never true  . Ran Out of Food in the Last Year: Never true  Transportation Needs: No Transportation Needs  . Lack of Transportation (Medical): No  . Lack of Transportation (Non-Medical): No  Physical Activity: Inactive  . Days of Exercise per Week: 0 days  . Minutes of Exercise per Session: 0 min  Stress: No Stress Concern Present  . Feeling of Stress : Not at all  Social Connections: Somewhat Isolated  . Frequency of Communication with Friends and Family: More than three times a week  . Frequency of Social Gatherings with Friends and Family: More than three times a week  . Attends Religious Services: More than 4 times per year  . Active Member of Clubs or Organizations: No  . Attends Archivist Meetings: Never  . Marital Status: Widowed    Outpatient Encounter Medications as of 07/09/2019  Medication Sig  . acetaminophen (TYLENOL) 500 MG tablet Take 500 mg by mouth as needed.  Marland Kitchen allopurinol (ZYLOPRIM) 100 MG tablet TAKE 2 TABLETS BY MOUTH EVERY DAY (Patient taking differently: Take 100 mg by mouth daily. TAKE 2 TABLETS BY MOUTH EVERY DAY)  . aspirin 81 MG tablet Take 81 mg by mouth daily.   . Biotin 1 MG CAPS Take 2 mg by mouth daily. Unsure dose  . furosemide (LASIX) 40 MG tablet TAKE 1 TABLET BY MOUTH DAILY (Patient taking differently: every other day. )  . ketoconazole (NIZORAL) 2 % cream Apply 1 application topically daily as needed.   Marland Kitchen levothyroxine (SYNTHROID) 137 MCG tablet TAKE 1 TABLET BY MOUTH DAILY  . metoprolol tartrate (LOPRESSOR) 25 MG tablet TAKE 1 TABLET (25 MG) BY MOUTH IN THE MORNING AND TAKE 2 TABLETS (50 MG) BY MOUTH IN THE EVENING (Patient taking differently: Take 25 mg by mouth 2 (two) times daily. )  . cyclobenzaprine (FLEXERIL) 5 MG tablet Take 1 tablet (5 mg total) by mouth 3 (three) times daily as needed for muscle spasms. (Patient not taking: Reported on 07/09/2019)  . Iron-Vitamins (GERITOL COMPLETE PO) Take  by mouth 2 (two) times a week.   No facility-administered encounter medications on file as of 07/09/2019.    Activities of Daily Living In your present state of health, do you have any difficulty performing the following activities: 07/09/2019  Hearing? N  Vision? N  Difficulty concentrating or making decisions? N  Walking or climbing stairs? Y  Comment Due to knee stiffness.  Dressing or bathing? N  Doing errands, shopping? N  Preparing Food and eating ? N  Using the Toilet? N  In the past six months, have you accidently leaked urine? N  Do you have problems with loss of bowel control? N  Managing your Medications? N  Managing your Finances? N  Housekeeping or managing your Housekeeping? N  Some recent data might be hidden    Patient Care Team: Rubye Beach as PCP - General (Family Medicine) Horald Chestnut,  DO as Consulting Physician (Nephrology)    Assessment:   This is a routine wellness examination for [place].  Exercise Activities and Dietary recommendations Current Exercise Habits: The patient does not participate in regular exercise at present, Exercise limited by: None identified  Goals    . Cut out extra servings     Recommend to continue cutting out the extra snacking and avoiding junk food.     . Reduce portion size     Recommend decreasing daily portion sizes.       Fall Risk: Fall Risk  07/09/2019 07/08/2018 07/04/2017 06/29/2016 06/23/2015  Falls in the past year? 0 0 No No No  Number falls in past yr: 0 - - - -  Injury with Fall? 0 - - - -    FALL RISK PREVENTION PERTAINING TO THE HOME:  Any stairs in or around the home? Yes  If so, are there any without handrails? No   Home free of loose throw rugs in walkways, pet beds, electrical cords, etc? Yes  Adequate lighting in your home to reduce risk of falls? Yes   ASSISTIVE DEVICES UTILIZED TO PREVENT FALLS:  Life alert? No  Use of a cane, walker or w/c? Yes  Grab bars in the bathroom? Yes   Shower chair or bench in shower? No  Elevated toilet seat or a handicapped toilet? Yes    TIMED UP AND GO:  Was the test performed? No .    Depression Screen PHQ 2/9 Scores 07/09/2019 07/08/2018 07/04/2017 07/04/2017  PHQ - 2 Score 0 0 1 1  PHQ- 9 Score - - 5 -     Cognitive Function     6CIT Screen 07/09/2019 07/08/2018 06/29/2016  What Year? 0 points 0 points 0 points  What month? 0 points 0 points 0 points  What time? 0 points 0 points 0 points  Count back from 20 0 points 0 points 0 points  Months in reverse 0 points 0 points 0 points  Repeat phrase 0 points 0 points 0 points  Total Score 0 0 0    Immunization History  Administered Date(s) Administered  . Pneumococcal Conjugate-13 06/22/2014  . Pneumococcal Polysaccharide-23 06/23/2015  . Td 08/09/1998    Qualifies for Shingles Vaccine? Yes . Due for Shingrix. Pt has been advised to call insurance company to determine out of pocket expense. Advised may also receive vaccine at local pharmacy or Health Dept. Verbalized acceptance and understanding.  Tdap: Up to date  Flu Vaccine: Due fall 2021.  Pneumococcal Vaccine: Completed series  Screening Tests Health Maintenance  Topic Date Due  . COVID-19 Vaccine (1) Never done  . DEXA SCAN  05/22/2016  . INFLUENZA VACCINE  09/07/2019  . TETANUS/TDAP  10/23/2020  . PNA vac Low Risk Adult  Completed    Cancer Screenings:  Colorectal Screening: No longer required.   Mammogram: No longer required.   Bone Density: Completed 05/23/11. Results reflect OSTEOPENIA. Repeat every 5 years. Declined order today.  Lung Cancer Screening: (Low Dose CT Chest recommended if Age 77-80 years, 30 pack-year currently smoking OR have quit w/in 15years.) does not qualify.   Additional Screening:  Vision Screening: Recommended annual ophthalmology exams for early detection of glaucoma and other disorders of the eye.  Dental Screening: Recommended annual dental exams for proper oral  hygiene  Community Resource Referral:  CRR required this visit? No     Plan:  I have personally reviewed and addressed the Medicare Annual Wellness questionnaire and have noted  the following in the patient's chart:  A. Medical and social history B. Use of alcohol, tobacco or illicit drugs  C. Current medications and supplements D. Functional ability and status E.  Nutritional status F.  Physical activity G. Advance directives H. List of other physicians I.  Hospitalizations, surgeries, and ER visits in previous 12 months J.  Twilight such as hearing and vision if needed, cognitive and depression L. Referrals and appointments   In addition, I have reviewed and discussed with patient certain preventive protocols, quality metrics, and best practice recommendations. A written personalized care plan for preventive services as well as general preventive health recommendations were provided to patient. Nurse Health Advisor  Signed,    Roderick Calo [place], [place]  06/13/9726 Nurse Health Advisor   Nurse Notes: Pt declined a DEXA scan order at this time.

## 2019-07-09 ENCOUNTER — Ambulatory Visit (INDEPENDENT_AMBULATORY_CARE_PROVIDER_SITE_OTHER): Payer: Medicare Other

## 2019-07-09 ENCOUNTER — Other Ambulatory Visit: Payer: Self-pay

## 2019-07-09 DIAGNOSIS — Z0000 Encounter for general adult medical examination without abnormal findings: Secondary | ICD-10-CM | POA: Diagnosis not present

## 2019-07-09 NOTE — Patient Instructions (Signed)
Sherry Long , Thank you for taking time to come for your Medicare Wellness Visit. I appreciate your ongoing commitment to your health goals. Please review the following plan we discussed and let me know if I can assist you in the future.   Screening recommendations/referrals: Colonoscopy: No longer required.  Mammogram: No longer required.  Bone Density: Currently over due. Declined order today.  Recommended yearly ophthalmology/optometry visit for glaucoma screening and checkup Recommended yearly dental visit for hygiene and checkup  Vaccinations: Influenza vaccine: Due fall 2021 Pneumococcal vaccine: Completed series Tdap vaccine: Up to date, due 10/2020 Shingles vaccine: Pt declines today.     Advanced directives: Currently on file.  Conditions/risks identified: Recommend to continue to cut portions sizes in half and avoid junk foods to help with weight loss.   Next appointment: 09/18/19 @ 10:00 AM with [place] 65 Years and Older, Female Preventive care refers to lifestyle choices and visits with your health care provider that can promote health and wellness. What does preventive care include?  A yearly physical exam. This is also called an annual well check.  Dental exams once or twice a year.  Routine eye exams. Ask your health care provider how often you should have your eyes checked.  Personal lifestyle choices, including:  Daily care of your teeth and gums.  Regular physical activity.  Eating a healthy diet.  Avoiding tobacco and drug use.  Limiting alcohol use.  Practicing safe sex.  Taking low-dose aspirin every day.  Taking vitamin and mineral supplements as recommended by your health care provider. What happens during an annual well check? The services and screenings done by your health care provider during your annual well check will depend on your age, overall health, lifestyle risk factors, and family history of  disease. Counseling  Your health care provider may ask you questions about your:  Alcohol use.  Tobacco use.  Drug use.  Emotional well-being.  Home and relationship well-being.  Sexual activity.  Eating habits.  History of falls.  Memory and ability to understand (cognition).  Work and work Statistician.  Reproductive health. Screening  You may have the following tests or measurements:  Height, weight, and BMI.  Blood pressure.  Lipid and cholesterol levels. These may be checked every 5 years, or more frequently if you are over 35 years old.  Skin check.  Lung cancer screening. You may have this screening every year starting at age 37 if you have a 30-pack-year history of smoking and currently smoke or have quit within the past 15 years.  Fecal occult blood test (FOBT) of the stool. You may have this test every year starting at age 66.  Flexible sigmoidoscopy or colonoscopy. You may have a sigmoidoscopy every 5 years or a colonoscopy every 10 years starting at age 53.  Hepatitis C blood test.  Hepatitis B blood test.  Sexually transmitted disease (STD) testing.  Diabetes screening. This is done by checking your blood sugar (glucose) after you have not eaten for a while (fasting). You may have this done every 1-3 years.  Bone density scan. This is done to screen for osteoporosis. You may have this done starting at age 60.  Mammogram. This may be done every 1-2 years. Talk to your health care provider about how often you should have regular mammograms. Talk with your health care provider about your test results, treatment options, and if necessary, the need for more tests. Vaccines  Your health care provider may  recommend certain vaccines, such as:  Influenza vaccine. This is recommended every year.  Tetanus, diphtheria, and acellular pertussis (Tdap, Td) vaccine. You may need a Td booster every 10 years.  Zoster vaccine. You may need this after age  6.  Pneumococcal 13-valent conjugate (PCV13) vaccine. One dose is recommended after age 79.  Pneumococcal polysaccharide (PPSV23) vaccine. One dose is recommended after age 79. Talk to your health care provider about which screenings and vaccines you need and how often you need them. This information is not intended to replace advice given to you by your health care provider. Make sure you discuss any questions you have with your health care provider. Document Released: 02/19/2015 Document Revised: 10/13/2015 Document Reviewed: 11/24/2014 Elsevier Interactive Patient Education  2017 [place] Prevention in the Home Falls can cause injuries. They can happen to people of all ages. There are many things you can do to make your home safe and to help prevent falls. What can I do on the outside of my home?  Regularly fix the edges of walkways and driveways and fix any cracks.  Remove anything that might make you trip as you walk through a door, such as a raised step or threshold.  Trim any bushes or trees on the path to your home.  Use bright outdoor lighting.  Clear any walking paths of anything that might make someone trip, such as rocks or tools.  Regularly check to see if handrails are loose or broken. Make sure that both sides of any steps have handrails.  Any raised decks and porches should have guardrails on the edges.  Have any leaves, snow, or ice cleared regularly.  Use sand or salt on walking paths during winter.  Clean up any spills in your garage right away. This includes oil or grease spills. What can I do in the bathroom?  Use night lights.  Install grab bars by the toilet and in the tub and shower. Do not use towel bars as grab bars.  Use non-skid mats or decals in the tub or shower.  If you need to sit down in the shower, use a plastic, non-slip stool.  Keep the floor dry. Clean up any water that spills on the floor as soon as it happens.  Remove  soap buildup in the tub or shower regularly.  Attach bath mats securely with double-sided non-slip rug tape.  Do not have throw rugs and other things on the floor that can make you trip. What can I do in the bedroom?  Use night lights.  Make sure that you have a light by your bed that is easy to reach.  Do not use any sheets or blankets that are too big for your bed. They should not hang down onto the floor.  Have a firm chair that has side arms. You can use this for support while you get dressed.  Do not have throw rugs and other things on the floor that can make you trip. What can I do in the kitchen?  Clean up any spills right away.  Avoid walking on wet floors.  Keep items that you use a lot in easy-to-reach places.  If you need to reach something above you, use a strong step stool that has a grab bar.  Keep electrical cords out of the way.  Do not use floor polish or wax that makes floors slippery. If you must use wax, use non-skid floor wax.  Do not have throw rugs and  other things on the floor that can make you trip. What can I do with my stairs?  Do not leave any items on the stairs.  Make sure that there are handrails on both sides of the stairs and use them. Fix handrails that are broken or loose. Make sure that handrails are as long as the stairways.  Check any carpeting to make sure that it is firmly attached to the stairs. Fix any carpet that is loose or worn.  Avoid having throw rugs at the top or bottom of the stairs. If you do have throw rugs, attach them to the floor with carpet tape.  Make sure that you have a light switch at the top of the stairs and the bottom of the stairs. If you do not have them, ask someone to add them for you. What else can I do to help prevent falls?  Wear shoes that:  Do not have high heels.  Have rubber bottoms.  Are comfortable and fit you well.  Are closed at the toe. Do not wear sandals.  If you use a  stepladder:  Make sure that it is fully opened. Do not climb a closed stepladder.  Make sure that both sides of the stepladder are locked into place.  Ask someone to hold it for you, if possible.  Clearly mark and make sure that you can see:  Any grab bars or handrails.  First and last steps.  Where the edge of each step is.  Use tools that help you move around (mobility aids) if they are needed. These include:  Canes.  Walkers.  Scooters.  Crutches.  Turn on the lights when you go into a dark area. Replace any light bulbs as soon as they burn out.  Set up your furniture so you have a clear path. Avoid moving your furniture around.  If any of your floors are uneven, fix them.  If there are any pets around you, be aware of where they are.  Review your medicines with your doctor. Some medicines can make you feel dizzy. This can increase your chance of falling. Ask your doctor what other things that you can do to help prevent falls. This information is not intended to replace advice given to you by your health care provider. Make sure you discuss any questions you have with your health care provider. Document Released: 11/19/2008 Document Revised: 07/01/2015 Document Reviewed: 02/27/2014 Elsevier Interactive Patient Education  2017 [employer].

## 2019-07-21 ENCOUNTER — Other Ambulatory Visit: Payer: Self-pay | Admitting: Physician Assistant

## 2019-07-21 DIAGNOSIS — M109 Gout, unspecified: Secondary | ICD-10-CM

## 2019-08-07 DIAGNOSIS — H25813 Combined forms of age-related cataract, bilateral: Secondary | ICD-10-CM | POA: Diagnosis not present

## 2019-09-16 NOTE — Progress Notes (Signed)
Complete physical exam   Patient: Sherry Long   DOB: Apr 05, 1940   79 y.o. Female  MRN: [id number] Visit Date: 09/18/2019  Today's healthcare provider: Mar Daring, PA-C   Chief Complaint  Patient presents with  . Annual Exam   Mertie Moores as a scribe for [employer], PA-C.,have documented all relevant documentation on the behalf of Mar Daring, PA-C,as directed by  Mar Daring, PA-C while in the presence of Mar Daring, [place].  Subjective    Sherry Long is a 79 y.o. female who presents today for a complete physical exam.  She reports consuming a general diet. Home exercise routine includes stretching. She generally feels well. She reports sleeping well. She does not have additional problems to discuss today.  HPI   Patient had AWV with [facility] 07/09/19  Chronic kidney disease: followed by Nephrology Adult hypothyroidism: Stable. Currently on levothyroxine 137 mcg.  Past Medical History:  Diagnosis Date  . Hypertension   . Morbid obesity ([place])    Past Surgical History:  Procedure Laterality Date  . TONSILLECTOMY AND ADENOIDECTOMY     Social History   Socioeconomic History  . Marital status: Widowed    Spouse name: Not on file  . Number of children: 1  . Years of education: Not on file  . Highest education level: Associate degree: occupational, Hotel manager, or vocational program  Occupational History  . Occupation: retired  Tobacco Use  . Smoking status: Former Smoker    Types: Cigarettes    Quit date: 02/06/1976    Years since quitting: 43.6  . Smokeless tobacco: Never Used  . Tobacco comment: quit in 20's  Vaping Use  . Vaping Use: Never used  Substance and Sexual Activity  . Alcohol use: No  . Drug use: No  . Sexual activity: Not on file  Other Topics Concern  . Not on file  Social History Narrative   1 son - deceased   Social Determinants of Health   Financial Resource Strain: Low Risk   .  Difficulty of Paying Living Expenses: Not hard at all  Food Insecurity: No Food Insecurity  . Worried About Charity fundraiser in the Last Year: Never true  . Ran Out of Food in the Last Year: Never true  Transportation Needs: No Transportation Needs  . Lack of Transportation (Medical): No  . Lack of Transportation (Non-Medical): No  Physical Activity: Inactive  . Days of Exercise per Week: 0 days  . Minutes of Exercise per Session: 0 min  Stress: No Stress Concern Present  . Feeling of Stress : Not at all  Social Connections: Moderately Isolated  . Frequency of Communication with Friends and Family: More than three times a week  . Frequency of Social Gatherings with Friends and Family: More than three times a week  . Attends Religious Services: More than 4 times per year  . Active Member of Clubs or Organizations: No  . Attends Archivist Meetings: Never  . Marital Status: Widowed  Intimate Partner Violence: Not At Risk  . Fear of Current or Ex-Partner: No  . Emotionally Abused: No  . Physically Abused: No  . Sexually Abused: No   Family Status  Relation Name Status  . Mother  Deceased at age 54       (71) PNEUMONIA  . Father  Deceased at age 48  . Sister  Alive  . Brother  Alive  . Sister  Alive  .  MGM  (Not Specified)  . Other nephew Deceased  . Son  Deceased       died drowning accident   Family History  Problem Relation Age of Onset  . Heart disease Mother   . Alzheimer's disease Mother   . Stroke Father   . Hypertension Sister   . Heart disease Sister   . Heart disease Maternal Grandmother   . Stroke Maternal Grandmother   . Colon cancer Other    Allergies  Allergen Reactions  . Aspirin Nausea Only    Upset stomach.  . Hydrochlorothiazide Other (See Comments)    Making gout worse    Patient Care Team: Mar Daring, PA-C as PCP - General (Family Medicine) Horald Chestnut, DO as Consulting Physician (Nephrology)    Medications: Outpatient Medications Prior to Visit  Medication Sig  . acetaminophen (TYLENOL) 500 MG tablet Take 500 mg by mouth as needed.  Marland Kitchen allopurinol (ZYLOPRIM) 100 MG tablet TAKE 2 TABLETS BY MOUTH EVERY DAY  . aspirin 81 MG tablet Take 81 mg by mouth daily.   . Biotin 1 MG CAPS Take 2 mg by mouth daily. Unsure dose  . furosemide (LASIX) 40 MG tablet TAKE 1 TABLET BY MOUTH DAILY (Patient taking differently: every other day. )  . Iron-Vitamins (GERITOL COMPLETE PO) Take by mouth 2 (two) times a week.  Marland Kitchen ketoconazole (NIZORAL) 2 % cream Apply 1 application topically daily as needed.   Marland Kitchen levothyroxine (SYNTHROID) 137 MCG tablet TAKE 1 TABLET BY MOUTH DAILY  . metoprolol tartrate (LOPRESSOR) 25 MG tablet TAKE 1 TABLET (25 MG) BY MOUTH IN THE MORNING AND TAKE 2 TABLETS (50 MG) BY MOUTH IN THE EVENING (Patient taking differently: Take 25 mg by mouth 2 (two) times daily. )  . [DISCONTINUED] cyclobenzaprine (FLEXERIL) 5 MG tablet Take 1 tablet (5 mg total) by mouth 3 (three) times daily as needed for muscle spasms. (Patient not taking: Reported on 07/09/2019)   No facility-administered medications prior to visit.    Review of Systems  Constitutional: Negative.   HENT: Negative.   Eyes: Negative.   Respiratory: Negative.   Cardiovascular: Negative.   Gastrointestinal: Negative.   Endocrine: Negative.   Genitourinary: Negative.   Musculoskeletal: Negative.   Skin: Negative.   Allergic/Immunologic: Negative.   Neurological: Negative.   Hematological: Negative.   Psychiatric/Behavioral: Negative.     Last CBC Lab Results  Component Value Date   WBC 4.2 09/09/2018   HGB 10.9 (L) 09/09/2018   HCT 32.7 (L) 09/09/2018   MCV 94 09/09/2018   MCH 31.3 09/09/2018   RDW 13.7 09/09/2018   PLT 247 22/03/5425   Last metabolic panel Lab Results  Component Value Date   GLUCOSE 90 09/09/2018   NA 137 09/09/2018   K 4.6 09/09/2018   CL 100 09/09/2018   CO2 20 09/09/2018   BUN 25  09/09/2018   CREATININE 2.40 (H) 09/09/2018   GFRNONAA 19 (L) 09/09/2018   GFRAA 22 (L) 09/09/2018   CALCIUM 10.0 09/09/2018   PHOS 3.7 09/09/2018   PROT 6.9 09/09/2018   ALBUMIN 4.1 09/09/2018   LABGLOB 2.8 09/09/2018   AGRATIO 1.5 09/09/2018   BILITOT 0.3 09/09/2018   ALKPHOS 142 (H) 09/09/2018   AST 20 09/09/2018   ALT 12 09/09/2018   Last lipids Lab Results  Component Value Date   CHOL 149 09/09/2018   HDL 72 09/09/2018   LDLCALC 62 09/09/2018   TRIG 75 09/09/2018   CHOLHDL 2.1 09/09/2018  Objective    BP (!) 191/83 (BP Location: Left Arm, Patient Position: Sitting, Cuff Size: Large)   Pulse 76   Temp 98.3 F (36.8 C) (Oral)   Ht 5\' 5"  (1.651 m)   Wt 273 lb 12.8 oz (124.2 kg)   BMI 45.56 kg/m  BP Readings from Last 3 Encounters:  09/18/19 (!) 183/96  09/09/18 (!) 166/86  07/04/17 138/68   Wt Readings from Last 3 Encounters:  09/18/19 273 lb 12.8 oz (124.2 kg)  09/09/18 277 lb (125.6 kg)  07/04/17 285 lb 6.4 oz (129.5 kg)      Physical Exam Vitals reviewed.  Constitutional:      General: She is not in acute distress.    Appearance: Normal appearance. She is well-developed and well-groomed. She is obese. She is not ill-appearing or diaphoretic.  HENT:     Head: Normocephalic and atraumatic.     Right Ear: Tympanic membrane, ear canal and external ear normal.     Left Ear: Tympanic membrane, ear canal and external ear normal.  Eyes:     General: No scleral icterus.       Right eye: No discharge.        Left eye: No discharge.     Extraocular Movements: Extraocular movements intact.     Conjunctiva/sclera: Conjunctivae normal.     Pupils: Pupils are equal, round, and reactive to light.  Neck:     Thyroid: No thyromegaly.     Vascular: No carotid bruit or JVD.     Trachea: No tracheal deviation.  Cardiovascular:     Rate and Rhythm: Normal rate and regular rhythm.     Pulses: Normal pulses.     Heart sounds: Murmur heard.  Systolic (heard best  over 2nd ICS RSB, but heard throughout; radiates to right carotid) murmur is present with a grade of 3/6.  No friction rub. No gallop.   Pulmonary:     Effort: Pulmonary effort is normal. No respiratory distress.     Breath sounds: Normal breath sounds. No wheezing or rales.  Chest:     Chest wall: No tenderness.  Abdominal:     General: Bowel sounds are normal. There is no distension.     Palpations: Abdomen is soft. There is no mass.     Tenderness: There is no abdominal tenderness. There is no guarding or rebound.  Musculoskeletal:        General: No tenderness. Normal range of motion.     Cervical back: Normal range of motion and neck supple.     Right lower leg: Edema (2-3+) present.     Left lower leg: Edema (2-3+) present.  Lymphadenopathy:     Cervical: No cervical adenopathy.  Skin:    General: Skin is warm and dry.     Findings: No rash.  Neurological:     Mental Status: She is alert and oriented to person, place, and time.  Psychiatric:        Behavior: Behavior normal. Behavior is cooperative.        Thought Content: Thought content normal.        Judgment: Judgment normal.      6CIT Screen 07/09/2019 07/08/2018 06/29/2016  What Year? 0 points 0 points 0 points  What month? 0 points 0 points 0 points  What time? 0 points 0 points 0 points  Count back from 20 0 points 0 points 0 points  Months in reverse 0 points 0 points 0 points  Repeat phrase 0  points 0 points 0 points  Total Score 0 0 0    Last depression screening scores PHQ 2/9 Scores 07/09/2019 07/08/2018 07/04/2017  PHQ - 2 Score 0 0 1  PHQ- 9 Score - - 5   Last fall risk screening Fall Risk  07/09/2019  Falls in the past year? 0  Number falls in past yr: 0  Injury with Fall? 0   Last Audit-C alcohol use screening Alcohol Use Disorder Test (AUDIT) 07/09/2019  1. How often do you have a drink containing alcohol? 0  2. How many drinks containing alcohol do you have on a typical day when you are drinking? 0   3. How often do you have six or more drinks on one occasion? 0  AUDIT-C Score 0  Alcohol Brief Interventions/Follow-up AUDIT Score <7 follow-up not indicated   A score of 3 or more in women, and 4 or more in men indicates increased risk for alcohol abuse, EXCEPT if all of the points are from question 1   No results found for any visits on 09/18/19.  Assessment & Plan    Routine Health Maintenance and Physical Exam  Exercise Activities and Dietary recommendations Goals    . Cut out extra servings     Recommend to continue cutting out the extra snacking and avoiding junk food.     . Reduce portion size     Recommend decreasing daily portion sizes.       Immunization History  Administered Date(s) Administered  . Pneumococcal Conjugate-13 06/22/2014  . Pneumococcal Polysaccharide-23 06/23/2015  . Td 08/09/1998    Health Maintenance  Topic Date Due  . Hepatitis C Screening  Never done  . COVID-19 Vaccine (1) Never done  . DEXA SCAN  05/22/2016  . INFLUENZA VACCINE  09/07/2019  . TETANUS/TDAP  10/23/2020  . PNA vac Low Risk Adult  Completed    Discussed health benefits of physical activity, and encouraged her to engage in regular exercise appropriate for her age and condition.  1. Annual physical exam Normal physical exam today. Will check labs as below and f/u pending lab results. If labs are stable and WNL she will not need to have these rechecked for one year at her next annual physical exam. She is to call the office in the meantime if she has any acute issue, questions or concerns. - CBC w/Diff/Platelet  2. Chronic kidney disease, stage IV (severe) ([place]) Followed by [facility]. Has f/u end of this month. Request labs to be done here. Will check labs as below and f/u pending results. - CBC w/Diff/Platelet - Comprehensive Metabolic Panel (CMET) - Phosphorus - Fe+TIBC+Fer - Parathyroid hormone, intact (no Ca)  3. Class 3 severe obesity due to excess calories  with serious comorbidity and body mass index (BMI) of 45.0 to 49.9 in adult [facility]) Counseled patient on healthy lifestyle modifications including dieting and exercise.  - CBC w/Diff/Platelet - Comprehensive Metabolic Panel (CMET) - HgB A1c - Lipid Panel With LDL/HDL Ratio  4. Essential hypertension Elevated today. Reports home readings are in the 150s. Nephrologist tried amlodipine 5mg  but her legs became more swollen and were painful so she stopped. He had also decreased her metoprolol to 25mg  BID, from 50mg . She reports after he stopped the amlodipine she never increased the metorprolol back. Will keep metoprolol the same 25mg  BID and add losartan as below. Will f/u BP and renal function.  - CBC w/Diff/Platelet - Comprehensive Metabolic Panel (CMET) - Lipid Panel With LDL/HDL Ratio -  losartan (COZAAR) 50 MG tablet; Take 1 tablet (50 mg total) by mouth daily.  Dispense: 30 tablet; Refill: 1  5. Adult hypothyroidism Had been stable on levothyroxine 154mcg. Will check labs as below and f/u pending results. - CBC w/Diff/Platelet - Comprehensive Metabolic Panel (CMET) - TSH  6. Other iron deficiency anemia Secondary to CKD. Has been off iron for about 6 months or so. Will check labs as below and f/u pending results. - CBC w/Diff/Platelet - Comprehensive Metabolic Panel (CMET) - Fe+TIBC+Fer  7. Encounter for hepatitis C screening test for low risk patient Will check labs as below and f/u pending results. - Hepatitis C Antibody   No follow-ups on file.     Reynolds Bowl, PA-C, have reviewed all documentation for this visit. The documentation on 09/18/19 for the exam, diagnosis, procedures, and orders are all accurate and complete.   Rubye Beach  [facility] [phone] (phone) [phone] (fax)  [place]

## 2019-09-18 ENCOUNTER — Other Ambulatory Visit: Payer: Self-pay

## 2019-09-18 ENCOUNTER — Encounter: Payer: Self-pay | Admitting: Physician Assistant

## 2019-09-18 ENCOUNTER — Other Ambulatory Visit: Payer: Self-pay | Admitting: Physician Assistant

## 2019-09-18 ENCOUNTER — Ambulatory Visit (INDEPENDENT_AMBULATORY_CARE_PROVIDER_SITE_OTHER): Payer: Medicare Other | Admitting: Physician Assistant

## 2019-09-18 VITALS — BP 183/96 | HR 76 | Temp 98.3°F | Ht 65.0 in | Wt 273.8 lb

## 2019-09-18 DIAGNOSIS — Z1159 Encounter for screening for other viral diseases: Secondary | ICD-10-CM

## 2019-09-18 DIAGNOSIS — Z0000 Encounter for general adult medical examination without abnormal findings: Secondary | ICD-10-CM

## 2019-09-18 DIAGNOSIS — N184 Chronic kidney disease, stage 4 (severe): Secondary | ICD-10-CM | POA: Diagnosis not present

## 2019-09-18 DIAGNOSIS — I10 Essential (primary) hypertension: Secondary | ICD-10-CM | POA: Diagnosis not present

## 2019-09-18 DIAGNOSIS — E039 Hypothyroidism, unspecified: Secondary | ICD-10-CM

## 2019-09-18 DIAGNOSIS — Z6841 Body Mass Index (BMI) 40.0 and over, adult: Secondary | ICD-10-CM

## 2019-09-18 DIAGNOSIS — D508 Other iron deficiency anemias: Secondary | ICD-10-CM

## 2019-09-18 MED ORDER — LOSARTAN POTASSIUM 50 MG PO TABS: 50.0000 mg | ORAL_TABLET | Freq: Every day | ORAL | 1 refills | Status: DC

## 2019-09-19 ENCOUNTER — Telehealth: Payer: Self-pay

## 2019-09-19 LAB — CBC WITH DIFFERENTIAL/PLATELET
Basophils Absolute: 0 10*3/uL (ref 0.0–0.2)
Basos: 0 %
EOS (ABSOLUTE): 0.1 10*3/uL (ref 0.0–0.4)
Eos: 1 %
Hematocrit: 30.8 % — ABNORMAL LOW (ref 34.0–46.6)
Hemoglobin: 10.6 g/dL — ABNORMAL LOW (ref 11.1–15.9)
Immature Grans (Abs): 0 10*3/uL (ref 0.0–0.1)
Immature Granulocytes: 0 %
Lymphocytes Absolute: 1.2 10*3/uL (ref 0.7–3.1)
Lymphs: 30 %
MCH: 32.4 pg (ref 26.6–33.0)
MCHC: 34.4 g/dL (ref 31.5–35.7)
MCV: 94 fL (ref 79–97)
Monocytes Absolute: 0.3 10*3/uL (ref 0.1–0.9)
Monocytes: 7 %
Neutrophils Absolute: 2.6 10*3/uL (ref 1.4–7.0)
Neutrophils: 62 %
Platelets: 234 10*3/uL (ref 150–450)
RBC: 3.27 x10E6/uL — ABNORMAL LOW (ref 3.77–5.28)
RDW: 13.2 % (ref 11.7–15.4)
WBC: 4.2 10*3/uL (ref 3.4–10.8)

## 2019-09-19 LAB — COMPREHENSIVE METABOLIC PANEL
ALT: 8 IU/L (ref 0–32)
AST: 16 IU/L (ref 0–40)
Albumin/Globulin Ratio: 1.7 (ref 1.2–2.2)
Albumin: 4.3 g/dL (ref 3.7–4.7)
Alkaline Phosphatase: 119 IU/L (ref 48–121)
BUN/Creatinine Ratio: 11 — ABNORMAL LOW (ref 12–28)
BUN: 26 mg/dL (ref 8–27)
Bilirubin Total: 0.3 mg/dL (ref 0.0–1.2)
CO2: 21 mmol/L (ref 20–29)
Calcium: 9.9 mg/dL (ref 8.7–10.3)
Chloride: 99 mmol/L (ref 96–106)
Creatinine, Ser: 2.38 mg/dL — ABNORMAL HIGH (ref 0.57–1.00)
GFR calc Af Amer: 22 mL/min/{1.73_m2} — ABNORMAL LOW (ref >59)
GFR calc non Af Amer: 19 mL/min/{1.73_m2} — ABNORMAL LOW (ref >59)
Globulin, Total: 2.5 g/dL (ref 1.5–4.5)
Glucose: 88 mg/dL (ref 65–99)
Potassium: 4.5 mmol/L (ref 3.5–5.2)
Sodium: 135 mmol/L (ref 134–144)
Total Protein: 6.8 g/dL (ref 6.0–8.5)

## 2019-09-19 LAB — HEMOGLOBIN A1C
Est. average glucose Bld gHb Est-mCnc: 108 mg/dL
Hgb A1c MFr Bld: 5.4 % (ref 4.8–5.6)

## 2019-09-19 LAB — IRON,TIBC AND FERRITIN PANEL
Ferritin: 195 ng/mL — ABNORMAL HIGH (ref 15–150)
Iron Saturation: 22 % (ref 15–55)
Iron: 53 ug/dL (ref 27–139)
Total Iron Binding Capacity: 239 ug/dL — ABNORMAL LOW (ref 250–450)
UIBC: 186 ug/dL (ref 118–369)

## 2019-09-19 LAB — LIPID PANEL WITH LDL/HDL RATIO
Cholesterol, Total: 150 mg/dL (ref 100–199)
HDL: 72 mg/dL (ref >39)
LDL Chol Calc (NIH): 64 mg/dL (ref 0–99)
LDL/HDL Ratio: 0.9 ratio (ref 0.0–3.2)
Triglycerides: 70 mg/dL (ref 0–149)
VLDL Cholesterol Cal: 14 mg/dL (ref 5–40)

## 2019-09-19 LAB — PARATHYROID HORMONE, INTACT (NO CA): PTH: 88 pg/mL — ABNORMAL HIGH (ref 15–65)

## 2019-09-19 LAB — HEPATITIS C ANTIBODY: Hep C Virus Ab: 0.2 s/co ratio (ref 0.0–0.9)

## 2019-09-19 LAB — TSH: TSH: 1.31 u[IU]/mL (ref 0.450–4.500)

## 2019-09-19 LAB — PHOSPHORUS: Phosphorus: 3.3 mg/dL (ref 3.0–4.3)

## 2019-09-19 NOTE — Telephone Encounter (Signed)
Attempted to contact patient, no answer left a voicemail. Okay for PEC triage to advise patient. ° °

## 2019-09-19 NOTE — Telephone Encounter (Signed)
Patient notified of lab results and PCP recommendations

## 2019-09-19 NOTE — Telephone Encounter (Signed)
-----   Message from Mar Daring, PA-C sent at 09/19/2019  7:48 AM EDT ----- Iron is starting to decrease just slightly. May could benefit from taking one iron supplement daily. Hemoglobin decreased just slightly from 10.9 to 10.6. Again this goes with the iron, so starting back one daily should help. Kidney function is stable. Liver enzymes are normal. Sodium, potassium, and calcium are normal. Cholesterol is normal. Sugar/A1c is normal. Thyroid is normal. Hep C screen is negative. Phosphorus is normal. Parathyroid hormone is still elevated but decreased from 139 to now 88.

## 2019-10-06 DIAGNOSIS — N184 Chronic kidney disease, stage 4 (severe): Secondary | ICD-10-CM | POA: Diagnosis not present

## 2019-10-15 ENCOUNTER — Telehealth: Payer: Self-pay

## 2019-10-15 MED ORDER — LOSARTAN POTASSIUM 25 MG PO TABS
25.0000 mg | ORAL_TABLET | Freq: Every day | ORAL | 3 refills | Status: DC
Start: 2019-10-15 — End: 2019-12-30

## 2019-10-15 NOTE — Telephone Encounter (Signed)
Please Review

## 2019-10-15 NOTE — Telephone Encounter (Signed)
Decrease dose to 25mg . Rx sent to pharmacy

## 2019-10-15 NOTE — Telephone Encounter (Signed)
Copied from [place] [id number]. Topic: General - Inquiry >> Oct 15, 2019  2:43 PM Mathis Bud wrote: Reason for CRM: Patient states that her new medication losartan is making her weak.  When patient was having symptoms she did stop taking medication.  Patient thinks 50mg  is too high.  Patient is requesting a call back.  Call back #[phone]

## 2019-10-16 NOTE — Telephone Encounter (Signed)
If the PEC would have read the note below we wanted to advise patient to decrease the dose to 25mg  from 50mg . I have sent in the 25mg  to [facility] in case the tablet is too hard to cut in half

## 2019-10-16 NOTE — Telephone Encounter (Signed)
Patient advised as directed below. 

## 2019-10-16 NOTE — Telephone Encounter (Signed)
Patient is calling to let [facility] Know that losartan 50mg  got her blood pressure down. While on the medication the patient reports that her BP was on October 06, 2019 123/71.  After 2 weeks on the medication she felt slugish, tired, and having to take a nap. Patient stopped taking the medication about a week ago. The Patient reports not feeling tired or sluggish after taking the medication. Patient believes the medication is too strong.  Patient has not taken her BP while being off of the medication.  Patient is wanting to know should she split the medication. Is it too strong for her.  But it seems to work well for BP. But patient does not like the washed out feeling.  Please advise CB- [phone]

## 2019-10-16 NOTE — Telephone Encounter (Signed)
Called and LM-If patient calls back ok for [facility] nurse to give providers message regarding the medication.

## 2019-11-05 ENCOUNTER — Other Ambulatory Visit: Payer: Self-pay | Admitting: Ophthalmology

## 2019-11-05 DIAGNOSIS — H34212 Partial retinal artery occlusion, left eye: Secondary | ICD-10-CM

## 2019-11-05 DIAGNOSIS — H2511 Age-related nuclear cataract, right eye: Secondary | ICD-10-CM | POA: Diagnosis not present

## 2019-11-11 ENCOUNTER — Other Ambulatory Visit: Payer: Self-pay

## 2019-11-11 ENCOUNTER — Ambulatory Visit
Admission: RE | Admit: 2019-11-11 | Discharge: 2019-11-11 | Disposition: A | Discharge status: Home / Self Care | Payer: Medicare Other | Source: Ambulatory Visit | Attending: Ophthalmology | Admitting: Ophthalmology

## 2019-11-11 DIAGNOSIS — H34212 Partial retinal artery occlusion, left eye: Secondary | ICD-10-CM | POA: Diagnosis not present

## 2019-11-11 DIAGNOSIS — I10 Essential (primary) hypertension: Secondary | ICD-10-CM | POA: Diagnosis not present

## 2019-11-11 DIAGNOSIS — I6523 Occlusion and stenosis of bilateral carotid arteries: Secondary | ICD-10-CM | POA: Diagnosis not present

## 2019-11-11 DIAGNOSIS — I771 Stricture of artery: Secondary | ICD-10-CM | POA: Diagnosis not present

## 2019-11-12 ENCOUNTER — Telehealth: Payer: Self-pay

## 2019-11-12 NOTE — Telephone Encounter (Signed)
Copied from [place] [phone]. Topic: General - Other >> Nov 12, 2019  1:27 PM Sherry Long wrote: PT is concern about her cholesterol level and requesting a call back from a nurse to go over her results and possible mediations / please advise

## 2019-11-25 ENCOUNTER — Other Ambulatory Visit: Payer: Self-pay | Admitting: Physician Assistant

## 2019-11-25 DIAGNOSIS — R609 Edema, unspecified: Secondary | ICD-10-CM

## 2019-11-28 DIAGNOSIS — I10 Essential (primary) hypertension: Secondary | ICD-10-CM | POA: Diagnosis not present

## 2019-11-28 DIAGNOSIS — H2511 Age-related nuclear cataract, right eye: Secondary | ICD-10-CM | POA: Diagnosis not present

## 2019-12-03 ENCOUNTER — Encounter: Payer: Self-pay | Admitting: Ophthalmology

## 2019-12-03 ENCOUNTER — Other Ambulatory Visit: Payer: Self-pay

## 2019-12-04 ENCOUNTER — Other Ambulatory Visit: Payer: Self-pay | Admitting: Physician Assistant

## 2019-12-04 DIAGNOSIS — E039 Hypothyroidism, unspecified: Secondary | ICD-10-CM

## 2019-12-05 ENCOUNTER — Other Ambulatory Visit: Payer: Self-pay

## 2019-12-05 ENCOUNTER — Other Ambulatory Visit
Admission: RE | Admit: 2019-12-05 | Discharge: 2019-12-05 | Disposition: A | Discharge status: Home / Self Care | Payer: Medicare Other | Source: Ambulatory Visit | Attending: Ophthalmology | Admitting: Ophthalmology

## 2019-12-05 DIAGNOSIS — Z01812 Encounter for preprocedural laboratory examination: Secondary | ICD-10-CM | POA: Insufficient documentation

## 2019-12-05 DIAGNOSIS — Z20822 Contact with and (suspected) exposure to covid-19: Secondary | ICD-10-CM | POA: Diagnosis not present

## 2019-12-06 LAB — SARS CORONAVIRUS 2 (TAT 6-24 HRS): SARS Coronavirus 2: NEGATIVE

## 2019-12-08 NOTE — Discharge Instructions (Signed)

## 2019-12-09 ENCOUNTER — Encounter: Payer: Self-pay | Admitting: Ophthalmology

## 2019-12-09 ENCOUNTER — Other Ambulatory Visit: Payer: Self-pay

## 2019-12-09 ENCOUNTER — Encounter
Admission: RE | Disposition: A | Discharge status: Home / Self Care | Payer: Self-pay | Source: Home / Self Care | Attending: Ophthalmology

## 2019-12-09 ENCOUNTER — Ambulatory Visit: Payer: Medicare Other | Admitting: Anesthesiology

## 2019-12-09 ENCOUNTER — Ambulatory Visit
Admission: RE | Admit: 2019-12-09 | Discharge: 2019-12-09 | Disposition: A | Discharge status: Home / Self Care | Payer: Medicare Other | Attending: Ophthalmology | Admitting: Ophthalmology

## 2019-12-09 DIAGNOSIS — Z886 Allergy status to analgesic agent status: Secondary | ICD-10-CM | POA: Diagnosis not present

## 2019-12-09 DIAGNOSIS — Z888 Allergy status to other drugs, medicaments and biological substances status: Secondary | ICD-10-CM | POA: Diagnosis not present

## 2019-12-09 DIAGNOSIS — H2511 Age-related nuclear cataract, right eye: Secondary | ICD-10-CM | POA: Insufficient documentation

## 2019-12-09 DIAGNOSIS — H25811 Combined forms of age-related cataract, right eye: Secondary | ICD-10-CM | POA: Diagnosis not present

## 2019-12-09 DIAGNOSIS — Z87891 Personal history of nicotine dependence: Secondary | ICD-10-CM | POA: Insufficient documentation

## 2019-12-09 HISTORY — PX: CATARACT EXTRACTION W/PHACO: SHX586

## 2019-12-09 HISTORY — DX: Presence of dental prosthetic device (complete) (partial): Z97.2

## 2019-12-09 HISTORY — DX: Sciatica, unspecified side: M54.30

## 2019-12-09 HISTORY — DX: Chronic kidney disease, stage 4 (severe): N18.4

## 2019-12-09 HISTORY — DX: Gout, unspecified: M10.9

## 2019-12-09 HISTORY — DX: Hypothyroidism, unspecified: E03.9

## 2019-12-09 HISTORY — DX: Unspecified osteoarthritis, unspecified site: M19.90

## 2019-12-09 SURGERY — PHACOEMULSIFICATION, CATARACT, WITH IOL INSERTION
Anesthesia: Monitor Anesthesia Care | Site: Eye | Laterality: Right

## 2019-12-09 MED ORDER — BRIMONIDINE TARTRATE-TIMOLOL 0.2-0.5 % OP SOLN
OPHTHALMIC | Status: DC | PRN
  Administered 2019-12-09: 1 [drp] via OPHTHALMIC

## 2019-12-09 MED ORDER — MOXIFLOXACIN HCL 0.5 % OP SOLN
OPHTHALMIC | Status: DC | PRN
  Administered 2019-12-09: 0.2 mL via OPHTHALMIC

## 2019-12-09 MED ORDER — LIDOCAINE HCL (PF) 2 % IJ SOLN
INTRAOCULAR | Status: DC | PRN
  Administered 2019-12-09: 1 mL

## 2019-12-09 MED ORDER — TETRACAINE HCL 0.5 % OP SOLN
1.0000 [drp] | OPHTHALMIC | Status: DC | PRN
  Administered 2019-12-09 (×3): 1 [drp] via OPHTHALMIC

## 2019-12-09 MED ORDER — MIDAZOLAM HCL 2 MG/2ML IJ SOLN
INTRAMUSCULAR | Status: DC | PRN
  Administered 2019-12-09: 1 mg via INTRAVENOUS

## 2019-12-09 MED ORDER — EPINEPHRINE PF 1 MG/ML IJ SOLN
INTRAOCULAR | Status: DC | PRN
  Administered 2019-12-09: 68 mL via OPHTHALMIC

## 2019-12-09 MED ORDER — LACTATED RINGERS IV SOLN: INTRAVENOUS | Status: DC

## 2019-12-09 MED ORDER — ARMC OPHTHALMIC DILATING DROPS
1.0000 "application " | OPHTHALMIC | Status: DC | PRN
  Administered 2019-12-09 (×3): 1 via OPHTHALMIC

## 2019-12-09 MED ORDER — FENTANYL CITRATE (PF) 100 MCG/2ML IJ SOLN
INTRAMUSCULAR | Status: DC | PRN
  Administered 2019-12-09: 50 ug via INTRAVENOUS

## 2019-12-09 MED ORDER — NA CHONDROIT SULF-NA HYALURON 40-17 MG/ML IO SOLN
INTRAOCULAR | Status: DC | PRN
  Administered 2019-12-09: 1 mL via INTRAOCULAR

## 2019-12-09 SURGICAL SUPPLY — 17 items
CANNULA ANT/CHMB 27GA (MISCELLANEOUS) ×6 IMPLANT
GLOVE SURG LX 8.0 MICRO (GLOVE) ×2
GLOVE SURG LX STRL 8.0 MICRO (GLOVE) ×1 IMPLANT
GLOVE SURG TRIUMPH 8.0 PF LTX (GLOVE) ×3 IMPLANT
GOWN STRL REUS W/ TWL LRG LVL3 (GOWN DISPOSABLE) ×2 IMPLANT
GOWN STRL REUS W/TWL LRG LVL3 (GOWN DISPOSABLE) ×6
LENS IOL TECNIS EYHANCE 19.0 (Intraocular Lens) ×3 IMPLANT
MARKER SKIN DUAL TIP RULER LAB (MISCELLANEOUS) ×3 IMPLANT
NEEDLE FILTER BLUNT 18X 1/2SAF (NEEDLE) ×2
NEEDLE FILTER BLUNT 18X1 1/2 (NEEDLE) ×1 IMPLANT
PACK EYE AFTER SURG (MISCELLANEOUS) ×3 IMPLANT
PACK OPTHALMIC (MISCELLANEOUS) ×3 IMPLANT
PACK PORFILIO (MISCELLANEOUS) ×3 IMPLANT
SYR 3ML LL SCALE MARK (SYRINGE) ×3 IMPLANT
SYR TB 1ML LUER SLIP (SYRINGE) ×3 IMPLANT
WATER STERILE IRR 250ML POUR (IV SOLUTION) ×3 IMPLANT
WIPE NON LINTING 3.25X3.25 (MISCELLANEOUS) ×3 IMPLANT

## 2019-12-09 NOTE — Anesthesia Postprocedure Evaluation (Signed)
Anesthesia Post Note  Patient: Sherry Long  Procedure(s) Performed: CATARACT EXTRACTION PHACO AND INTRAOCULAR LENS PLACEMENT (IOC) RIGHT 4.79 00:37.7 (Right Eye)     Patient location during evaluation: PACU Anesthesia Type: MAC Level of consciousness: awake and alert and oriented Pain management: satisfactory to patient Vital Signs Assessment: post-procedure vital signs reviewed and stable Respiratory status: spontaneous breathing, nonlabored ventilation and respiratory function stable Cardiovascular status: blood pressure returned to baseline and stable Postop Assessment: Adequate PO intake and No signs of nausea or vomiting Anesthetic complications: no   No complications documented.  Raliegh Ip

## 2019-12-09 NOTE — Anesthesia Procedure Notes (Signed)
Procedure Name: MAC Date/Time: 12/09/2019 9:37 AM Performed by: Silvana Newness, CRNA Pre-anesthesia Checklist: Patient identified, Emergency Drugs available, Suction available, Patient being monitored and Timeout performed Patient Re-evaluated:Patient Re-evaluated prior to induction Oxygen Delivery Method: Nasal cannula Placement Confirmation: positive ETCO2

## 2019-12-09 NOTE — H&P (Signed)
[facility]   Primary Care Physician:  Mar Daring, PA-C Ophthalmologist: Dr. George Ina  Pre-Procedure History & Physical: HPI:  Sherry Long is a 79 y.o. female here for cataract surgery.   Past Medical History:  Diagnosis Date  . Arthritis    lumbar spine  . Chronic kidney disease (CKD), stage IV (severe) ([place])   . Gout   . Hypertension   . Hypothyroidism   . Morbid obesity ([place])   . Sciatica    right  . Wears dentures    full upper    Past Surgical History:  Procedure Laterality Date  . COLONOSCOPY    . [facility]    Prior to Admission medications   Medication Sig Start Date End Date Taking? Authorizing Provider  acetaminophen (TYLENOL) 500 MG tablet Take 500 mg by mouth as needed.   Yes [provider]  allopurinol (ZYLOPRIM) 100 MG tablet TAKE 2 TABLETS BY MOUTH EVERY DAY 07/21/19  Yes Mar Daring, PA-C  Ascorbic Acid (VITAMIN C PO) Take by mouth daily.   Yes [provider]  aspirin 81 MG tablet Take 81 mg by mouth daily.  04/25/10  Yes [provider]  Biotin 1 MG CAPS Take 1 mg by mouth daily. Unsure dose    Yes [provider]  furosemide (LASIX) 40 MG tablet TAKE 1 TABLET BY MOUTH DAILY Patient taking differently: every other day.  11/25/19  Yes Mar Daring, PA-C  Iron-Vitamins (GERITOL COMPLETE PO) Take by mouth 2 (two) times a week.   Yes [provider]  ketoconazole (NIZORAL) 2 % cream Apply 1 application topically daily as needed.  11/21/12  Yes [provider]  levothyroxine (SYNTHROID) 137 MCG tablet TAKE 1 TABLET BY MOUTH DAILY 12/04/19  Yes Fenton Malling M, PA-C  losartan (COZAAR) 25 MG tablet Take 1 tablet (25 mg total) by mouth daily. 10/15/19  Yes Mar Daring, PA-C  metoprolol tartrate (LOPRESSOR) 25 MG tablet TAKE 1 TABLET (25 MG) BY MOUTH IN THE MORNING AND TAKE 2 TABLETS (50 MG) BY MOUTH IN THE EVENING Patient taking  differently: Take 25 mg by mouth 2 (two) times daily.  01/24/19  Yes Mar Daring, PA-C    Allergies as of 11/06/2019 - Review Complete 09/18/2019  Allergen Reaction Noted  . Aspirin Nausea Only 12/11/2014  . Hydrochlorothiazide Other (See Comments) 06/23/2015    Family History  Problem Relation Age of Onset  . Heart disease Mother   . Alzheimer's disease Mother   . Stroke Father   . Hypertension Sister   . Heart disease Sister   . Heart disease Maternal Grandmother   . Stroke Maternal Grandmother   . Colon cancer Other     Social History   Socioeconomic History  . Marital status: Widowed    Spouse name: Not on file  . Number of children: 1  . Years of education: Not on file  . Highest education level: Associate degree: occupational, Hotel manager, or vocational program  Occupational History  . Occupation: retired  Tobacco Use  . Smoking status: Former Smoker    Types: Cigarettes    Quit date: 02/06/1976    Years since quitting: 43.8  . Smokeless tobacco: Never Used  . Tobacco comment: quit in 20's  Vaping Use  . Vaping Use: Never used  Substance and Sexual Activity  . Alcohol use: No  . Drug use: No  . Sexual activity: Not on file  Other Topics Concern  .  Not on file  Social History Narrative   1 son - deceased   Social Determinants of Health   Financial Resource Strain: Low Risk   . Difficulty of Paying Living Expenses: Not hard at all  Food Insecurity: No Food Insecurity  . Worried About Charity fundraiser in the Last Year: Never true  . Ran Out of Food in the Last Year: Never true  Transportation Needs: No Transportation Needs  . Lack of Transportation (Medical): No  . Lack of Transportation (Non-Medical): No  Physical Activity: Inactive  . Days of Exercise per Week: 0 days  . Minutes of Exercise per Session: 0 min  Stress: No Stress Concern Present  . Feeling of Stress : Not at all  Social Connections: Moderately Isolated  . Frequency of  Communication with Friends and Family: More than three times a week  . Frequency of Social Gatherings with Friends and Family: More than three times a week  . Attends Religious Services: More than 4 times per year  . Active Member of Clubs or Organizations: No  . Attends Archivist Meetings: Never  . Marital Status: Widowed  Intimate Partner Violence: Not At Risk  . Fear of Current or Ex-Partner: No  . Emotionally Abused: No  . Physically Abused: No  . Sexually Abused: No    Review of Systems: See HPI, otherwise negative ROS  Physical Exam: BP (!) 183/74   Pulse 72   Temp (!) 97.5 F (36.4 C) (Temporal)   Resp 16   Ht 5\' 6"  (1.676 m)   Wt 122.5 kg   SpO2 100%   BMI 43.58 kg/m  General:   Alert,  pleasant and cooperative in NAD Head:  Normocephalic and atraumatic. Lungs:  Clear to auscultation.    Heart:  Regular rate and rhythm.   Impression/Plan: Sherry Long is here for cataract surgery.  Risks, benefits, limitations, and alternatives regarding cataract surgery have been reviewed with the patient.  Questions have been answered.  All parties agreeable.   Birder Robson, MD  12/09/2019, 9:25 AM

## 2019-12-09 NOTE — Transfer of Care (Signed)
Immediate Anesthesia Transfer of Care Note  Patient: Sherry Long  Procedure(s) Performed: CATARACT EXTRACTION PHACO AND INTRAOCULAR LENS PLACEMENT (IOC) RIGHT 4.79 00:37.7 (Right Eye)  Patient Location: PACU  Anesthesia Type: MAC  Level of Consciousness: awake, alert  and patient cooperative  Airway and Oxygen Therapy: Patient Spontanous Breathing and Patient connected to supplemental oxygen  Post-op Assessment: Post-op Vital signs reviewed, Patient's Cardiovascular Status Stable, Respiratory Function Stable, Patent Airway and No signs of Nausea or vomiting  Post-op Vital Signs: Reviewed and stable  Complications: No complications documented.

## 2019-12-09 NOTE — Op Note (Signed)
PREOPERATIVE DIAGNOSIS:  Nuclear sclerotic cataract of the right eye.   POSTOPERATIVE DIAGNOSIS:  H25.11 Cataract   OPERATIVE PROCEDURE:@   SURGEON:  Birder Robson, MD.   ANESTHESIA:  Anesthesiologist: Ronelle Nigh, MD CRNA: Silvana Newness, CRNA  1.      Managed anesthesia care. 2.      0.61ml of Shugarcaine was instilled in the eye following the paracentesis.   COMPLICATIONS:  None.   TECHNIQUE:   Stop and chop   DESCRIPTION OF PROCEDURE:  The patient was examined and consented in the preoperative holding area where the aforementioned topical anesthesia was applied to the right eye and then brought back to the Operating Room where the right eye was prepped and draped in the usual sterile ophthalmic fashion and a lid speculum was placed. A paracentesis was created with the side port blade and the anterior chamber was filled with viscoelastic. A near clear corneal incision was performed with the steel keratome. A continuous curvilinear capsulorrhexis was performed with a cystotome followed by the capsulorrhexis forceps. Hydrodissection and hydrodelineation were carried out with BSS on a blunt cannula. The lens was removed in a stop and chop  technique and the remaining cortical material was removed with the irrigation-aspiration handpiece. The capsular bag was inflated with viscoelastic and the Technis ZCB00  lens was placed in the capsular bag without complication. The remaining viscoelastic was removed from the eye with the irrigation-aspiration handpiece. The wounds were hydrated. The anterior chamber was flushed with BSS and the eye was inflated to physiologic pressure. 0.58ml of Vigamox was placed in the anterior chamber. The wounds were found to be water tight. The eye was dressed with Combigan. The patient was given protective glasses to wear throughout the day and a shield with which to sleep tonight. The patient was also given drops with which to begin a drop regimen today and will  follow-up with me in one day. Implant Name Type Inv. Item Serial No. Manufacturer Lot No. LRB No. Used Action  LENS IOL TECNIS EYHANCE 19.0 - [id number] Intraocular Lens LENS IOL TECNIS EYHANCE 19.0 [id number] JOHNSON   Right 1 Implanted   Procedure(s): CATARACT EXTRACTION PHACO AND INTRAOCULAR LENS PLACEMENT (IOC) RIGHT 4.79 00:37.7 (Right)  Electronically signed: Birder Robson 12/09/2019 9:56 AM

## 2019-12-09 NOTE — Anesthesia Preprocedure Evaluation (Addendum)
Anesthesia Evaluation  Patient identified by MRN, date of birth, ID band Patient awake    Reviewed: Allergy & Precautions, H&P , NPO status , Patient's Chart, lab work & pertinent test results  Airway Mallampati: II  TM Distance: >3 FB Neck ROM: full    Dental no notable dental hx.    Pulmonary former smoker,    Pulmonary exam normal breath sounds clear to auscultation       Cardiovascular hypertension,  Rhythm:regular Rate:Normal + Systolic murmurs    Neuro/Psych    GI/Hepatic   Endo/Other  Hypothyroidism Morbid obesity  Renal/GU Renal disease     Musculoskeletal   Abdominal   Peds  Hematology   Anesthesia Other Findings   Reproductive/Obstetrics                            Anesthesia Physical Anesthesia Plan  ASA: III  Anesthesia Plan: MAC   Post-op Pain Management:    Induction:   PONV Risk Score and Plan: 2 and Treatment may vary due to age or medical condition, TIVA and Midazolam  Airway Management Planned:   Additional Equipment:   Intra-op Plan:   Post-operative Plan:   Informed Consent: I have reviewed the patients History and Physical, chart, labs and discussed the procedure including the risks, benefits and alternatives for the proposed anesthesia with the patient or authorized representative who has indicated his/her understanding and acceptance.     Dental Advisory Given  Plan Discussed with: CRNA  Anesthesia Plan Comments:         Anesthesia Quick Evaluation

## 2019-12-10 ENCOUNTER — Encounter: Payer: Self-pay | Admitting: Ophthalmology

## 2019-12-15 ENCOUNTER — Telehealth: Payer: Self-pay

## 2019-12-15 DIAGNOSIS — N184 Chronic kidney disease, stage 4 (severe): Secondary | ICD-10-CM

## 2019-12-15 DIAGNOSIS — D508 Other iron deficiency anemias: Secondary | ICD-10-CM

## 2019-12-15 NOTE — Telephone Encounter (Signed)
NA to advise that labs have been ordered. If patient calls back ok for [facility] nurse to advise. Thanks

## 2019-12-15 NOTE — Telephone Encounter (Signed)
Labs ordered.

## 2019-12-15 NOTE — Telephone Encounter (Signed)
Copied from [place] [id number]. Topic: General - Other >> Dec 15, 2019  3:34 PM Sherry Long wrote: PT requesting a lab order to be place for Friday morning / please advise

## 2019-12-15 NOTE — Telephone Encounter (Signed)
Please review.  I'm not sure what lab work she is due for.   Thanks,   -Mickel Baas

## 2019-12-16 NOTE — Telephone Encounter (Signed)
Patient returned call and advised of the below.When lab results come back patient wants to ensure Horald Chestnut, DO Nephrologist is made aware.

## 2019-12-16 NOTE — Telephone Encounter (Signed)
Just FYI. Thanks!  

## 2019-12-16 NOTE — Telephone Encounter (Signed)
Patient returned call and notified by agent:Tiffany that labs have been ordered

## 2019-12-16 NOTE — Telephone Encounter (Signed)
Left vm for pt. That labs have been ordered by PCP; call office if further questions.

## 2019-12-17 NOTE — Addendum Note (Signed)
Addended by: Mar Daring on: 12/17/2019 06:05 PM   Modules accepted: Orders

## 2019-12-19 DIAGNOSIS — N184 Chronic kidney disease, stage 4 (severe): Secondary | ICD-10-CM | POA: Diagnosis not present

## 2019-12-19 DIAGNOSIS — H2512 Age-related nuclear cataract, left eye: Secondary | ICD-10-CM | POA: Diagnosis not present

## 2019-12-19 DIAGNOSIS — D508 Other iron deficiency anemias: Secondary | ICD-10-CM | POA: Diagnosis not present

## 2019-12-19 DIAGNOSIS — I10 Essential (primary) hypertension: Secondary | ICD-10-CM | POA: Diagnosis not present

## 2019-12-20 LAB — IRON,TIBC AND FERRITIN PANEL
Ferritin: 216 ng/mL — ABNORMAL HIGH (ref 15–150)
Iron Saturation: 35 % (ref 15–55)
Iron: 88 ug/dL (ref 27–139)
Total Iron Binding Capacity: 255 ug/dL (ref 250–450)
UIBC: 167 ug/dL (ref 118–369)

## 2019-12-20 LAB — BASIC METABOLIC PANEL
BUN/Creatinine Ratio: 11 — ABNORMAL LOW (ref 12–28)
BUN: 25 mg/dL (ref 8–27)
CO2: 20 mmol/L (ref 20–29)
Calcium: 9.9 mg/dL (ref 8.7–10.3)
Chloride: 98 mmol/L (ref 96–106)
Creatinine, Ser: 2.33 mg/dL — ABNORMAL HIGH (ref 0.57–1.00)
GFR calc Af Amer: 22 mL/min/{1.73_m2} — ABNORMAL LOW (ref >59)
GFR calc non Af Amer: 19 mL/min/{1.73_m2} — ABNORMAL LOW (ref >59)
Glucose: 123 mg/dL — ABNORMAL HIGH (ref 65–99)
Potassium: 4.8 mmol/L (ref 3.5–5.2)
Sodium: 133 mmol/L — ABNORMAL LOW (ref 134–144)

## 2019-12-20 LAB — CBC WITH DIFFERENTIAL/PLATELET
Basophils Absolute: 0 10*3/uL (ref 0.0–0.2)
Basos: 1 %
EOS (ABSOLUTE): 0 10*3/uL (ref 0.0–0.4)
Eos: 1 %
Hematocrit: 32.4 % — ABNORMAL LOW (ref 34.0–46.6)
Hemoglobin: 10.9 g/dL — ABNORMAL LOW (ref 11.1–15.9)
Immature Grans (Abs): 0 10*3/uL (ref 0.0–0.1)
Immature Granulocytes: 0 %
Lymphocytes Absolute: 1.3 10*3/uL (ref 0.7–3.1)
Lymphs: 30 %
MCH: 32 pg (ref 26.6–33.0)
MCHC: 33.6 g/dL (ref 31.5–35.7)
MCV: 95 fL (ref 79–97)
Monocytes Absolute: 0.4 10*3/uL (ref 0.1–0.9)
Monocytes: 8 %
Neutrophils Absolute: 2.8 10*3/uL (ref 1.4–7.0)
Neutrophils: 60 %
Platelets: 256 10*3/uL (ref 150–450)
RBC: 3.41 x10E6/uL — ABNORMAL LOW (ref 3.77–5.28)
RDW: 13.7 % (ref 11.7–15.4)
WBC: 4.5 10*3/uL (ref 3.4–10.8)

## 2019-12-20 LAB — MICROALBUMIN / CREATININE URINE RATIO
Creatinine, Urine: 157.8 mg/dL
Microalb/Creat Ratio: 296 mg/g creat — ABNORMAL HIGH (ref 0–29)
Microalbumin, Urine: 467.1 ug/mL

## 2019-12-20 LAB — PARATHYROID HORMONE, INTACT (NO CA): PTH: 108 pg/mL — ABNORMAL HIGH (ref 15–65)

## 2019-12-22 NOTE — Telephone Encounter (Signed)
Labs printed and will be faxed to [facility]

## 2019-12-23 ENCOUNTER — Other Ambulatory Visit: Payer: Self-pay

## 2019-12-23 ENCOUNTER — Encounter: Payer: Self-pay | Admitting: Ophthalmology

## 2019-12-26 ENCOUNTER — Other Ambulatory Visit: Payer: Self-pay

## 2019-12-26 ENCOUNTER — Other Ambulatory Visit
Admission: RE | Admit: 2019-12-26 | Discharge: 2019-12-26 | Disposition: A | Discharge status: Home / Self Care | Payer: Medicare Other | Source: Ambulatory Visit | Attending: Ophthalmology | Admitting: Ophthalmology

## 2019-12-26 DIAGNOSIS — Z01812 Encounter for preprocedural laboratory examination: Secondary | ICD-10-CM | POA: Diagnosis not present

## 2019-12-26 DIAGNOSIS — Z20822 Contact with and (suspected) exposure to covid-19: Secondary | ICD-10-CM | POA: Insufficient documentation

## 2019-12-26 LAB — SARS CORONAVIRUS 2 (TAT 6-24 HRS): SARS Coronavirus 2: NEGATIVE

## 2019-12-26 NOTE — Discharge Instructions (Signed)

## 2019-12-29 ENCOUNTER — Telehealth: Payer: Self-pay | Admitting: *Deleted

## 2019-12-29 ENCOUNTER — Other Ambulatory Visit: Payer: Self-pay | Admitting: Physician Assistant

## 2019-12-29 DIAGNOSIS — M109 Gout, unspecified: Secondary | ICD-10-CM

## 2019-12-29 NOTE — Telephone Encounter (Signed)
Yes was ok to fax

## 2019-12-29 NOTE — Telephone Encounter (Signed)
Copied from [place] [id number]. Topic: General - Other >> Dec 29, 2019  2:59 PM Antonieta Iba C wrote: Reason for CRM: Lovey Newcomer with [facility] is calling in to request pt's labs for DIS: 12/19/19. They are seeing pt also and is hoping to not have to repeat labs.    Fax #: [phone] ---Loann QuillLovey Newcomer   Phone: [phone]

## 2019-12-29 NOTE — Telephone Encounter (Signed)
faxed

## 2019-12-29 NOTE — Telephone Encounter (Signed)
This is ok to send right?

## 2019-12-30 ENCOUNTER — Encounter: Payer: Self-pay | Admitting: Ophthalmology

## 2019-12-30 ENCOUNTER — Other Ambulatory Visit: Payer: Self-pay | Admitting: Physician Assistant

## 2019-12-30 ENCOUNTER — Ambulatory Visit: Payer: Medicare Other | Admitting: Anesthesiology

## 2019-12-30 ENCOUNTER — Ambulatory Visit
Admission: RE | Admit: 2019-12-30 | Discharge: 2019-12-30 | Disposition: A | Discharge status: Home / Self Care | Payer: Medicare Other | Attending: Ophthalmology | Admitting: Ophthalmology

## 2019-12-30 ENCOUNTER — Encounter
Admission: RE | Disposition: A | Discharge status: Home / Self Care | Payer: Self-pay | Source: Home / Self Care | Attending: Ophthalmology

## 2019-12-30 ENCOUNTER — Other Ambulatory Visit: Payer: Self-pay

## 2019-12-30 DIAGNOSIS — Z886 Allergy status to analgesic agent status: Secondary | ICD-10-CM | POA: Diagnosis not present

## 2019-12-30 DIAGNOSIS — Z79890 Hormone replacement therapy (postmenopausal): Secondary | ICD-10-CM | POA: Diagnosis not present

## 2019-12-30 DIAGNOSIS — H25812 Combined forms of age-related cataract, left eye: Secondary | ICD-10-CM | POA: Diagnosis not present

## 2019-12-30 DIAGNOSIS — H2512 Age-related nuclear cataract, left eye: Secondary | ICD-10-CM | POA: Diagnosis not present

## 2019-12-30 DIAGNOSIS — Z888 Allergy status to other drugs, medicaments and biological substances status: Secondary | ICD-10-CM | POA: Insufficient documentation

## 2019-12-30 DIAGNOSIS — Z7982 Long term (current) use of aspirin: Secondary | ICD-10-CM | POA: Insufficient documentation

## 2019-12-30 DIAGNOSIS — Z79899 Other long term (current) drug therapy: Secondary | ICD-10-CM | POA: Diagnosis not present

## 2019-12-30 DIAGNOSIS — Z87891 Personal history of nicotine dependence: Secondary | ICD-10-CM | POA: Insufficient documentation

## 2019-12-30 DIAGNOSIS — I10 Essential (primary) hypertension: Secondary | ICD-10-CM

## 2019-12-30 DIAGNOSIS — R609 Edema, unspecified: Secondary | ICD-10-CM

## 2019-12-30 HISTORY — PX: CATARACT EXTRACTION W/PHACO: SHX586

## 2019-12-30 SURGERY — PHACOEMULSIFICATION, CATARACT, WITH IOL INSERTION
Anesthesia: Monitor Anesthesia Care | Site: Eye | Laterality: Left

## 2019-12-30 MED ORDER — METOPROLOL TARTRATE 25 MG PO TABS: 25.0000 mg | ORAL_TABLET | Freq: Two times a day (BID) | ORAL | 3 refills | Status: DC

## 2019-12-30 MED ORDER — ACETAMINOPHEN 10 MG/ML IV SOLN: 1000.0000 mg | Freq: Once | INTRAVENOUS | Status: DC | PRN

## 2019-12-30 MED ORDER — LACTATED RINGERS IV SOLN: INTRAVENOUS | Status: DC

## 2019-12-30 MED ORDER — ARMC OPHTHALMIC DILATING DROPS
1.0000 "application " | OPHTHALMIC | Status: DC | PRN
  Administered 2019-12-30 (×3): 1 via OPHTHALMIC

## 2019-12-30 MED ORDER — FUROSEMIDE 40 MG PO TABS: 40.0000 mg | ORAL_TABLET | ORAL | 3 refills | Status: DC

## 2019-12-30 MED ORDER — EPINEPHRINE PF 1 MG/ML IJ SOLN
INTRAOCULAR | Status: DC | PRN
  Administered 2019-12-30: 58 mL via OPHTHALMIC

## 2019-12-30 MED ORDER — LOSARTAN POTASSIUM 25 MG PO TABS: 25.0000 mg | ORAL_TABLET | Freq: Every day | ORAL | 3 refills | Status: DC

## 2019-12-30 MED ORDER — TETRACAINE HCL 0.5 % OP SOLN
1.0000 [drp] | OPHTHALMIC | Status: DC | PRN
  Administered 2019-12-30 (×3): 1 [drp] via OPHTHALMIC

## 2019-12-30 MED ORDER — MOXIFLOXACIN HCL 0.5 % OP SOLN
OPHTHALMIC | Status: DC | PRN
  Administered 2019-12-30: 0.2 mL via OPHTHALMIC

## 2019-12-30 MED ORDER — ONDANSETRON HCL 4 MG/2ML IJ SOLN: 4.0000 mg | Freq: Once | INTRAMUSCULAR | Status: DC | PRN

## 2019-12-30 MED ORDER — NA CHONDROIT SULF-NA HYALURON 40-17 MG/ML IO SOLN
INTRAOCULAR | Status: DC | PRN
  Administered 2019-12-30: 1 mL via INTRAOCULAR

## 2019-12-30 MED ORDER — LIDOCAINE HCL (PF) 2 % IJ SOLN
INTRAOCULAR | Status: DC | PRN
  Administered 2019-12-30: 2 mL

## 2019-12-30 MED ORDER — BRIMONIDINE TARTRATE-TIMOLOL 0.2-0.5 % OP SOLN
OPHTHALMIC | Status: DC | PRN
  Administered 2019-12-30: 1 [drp] via OPHTHALMIC

## 2019-12-30 MED ORDER — MIDAZOLAM HCL 2 MG/2ML IJ SOLN
INTRAMUSCULAR | Status: DC | PRN
  Administered 2019-12-30: 1 mg via INTRAVENOUS

## 2019-12-30 MED ORDER — FENTANYL CITRATE (PF) 100 MCG/2ML IJ SOLN
INTRAMUSCULAR | Status: DC | PRN
  Administered 2019-12-30: 50 ug via INTRAVENOUS

## 2019-12-30 SURGICAL SUPPLY — 19 items
CANNULA ANT/CHMB 27G (MISCELLANEOUS) ×2 IMPLANT
CANNULA ANT/CHMB 27GA (MISCELLANEOUS) ×6 IMPLANT
GLOVE SURG LX 8.0 MICRO (GLOVE) ×2
GLOVE SURG LX STRL 8.0 MICRO (GLOVE) ×1 IMPLANT
GLOVE SURG TRIUMPH 8.0 PF LTX (GLOVE) ×3 IMPLANT
GOWN STRL REUS W/ TWL LRG LVL3 (GOWN DISPOSABLE) ×2 IMPLANT
GOWN STRL REUS W/TWL LRG LVL3 (GOWN DISPOSABLE) ×6
LENS IOL TECNIS EYHANCE 19.5 (Intraocular Lens) ×2 IMPLANT
MARKER SKIN DUAL TIP RULER LAB (MISCELLANEOUS) ×3 IMPLANT
NDL FILTER BLUNT 18X1 1/2 (NEEDLE) ×1 IMPLANT
NEEDLE FILTER BLUNT 18X 1/2SAF (NEEDLE) ×2
NEEDLE FILTER BLUNT 18X1 1/2 (NEEDLE) ×1 IMPLANT
PACK EYE AFTER SURG (MISCELLANEOUS) ×3 IMPLANT
PACK OPTHALMIC (MISCELLANEOUS) ×3 IMPLANT
PACK PORFILIO (MISCELLANEOUS) ×3 IMPLANT
SYR 3ML LL SCALE MARK (SYRINGE) ×3 IMPLANT
SYR TB 1ML LUER SLIP (SYRINGE) ×3 IMPLANT
WATER STERILE IRR 250ML POUR (IV SOLUTION) ×3 IMPLANT
WIPE NON LINTING 3.25X3.25 (MISCELLANEOUS) ×3 IMPLANT

## 2019-12-30 NOTE — Anesthesia Preprocedure Evaluation (Signed)
Anesthesia Evaluation  Patient identified by MRN, date of birth, ID band Patient awake    Reviewed: Allergy & Precautions, H&P , NPO status , Patient's Chart, lab work & pertinent test results, reviewed documented beta blocker date and time   History of Anesthesia Complications Negative for: history of anesthetic complications  Airway Mallampati: III  TM Distance: >3 FB Neck ROM: full    Dental no notable dental hx. (+) Upper Dentures   Pulmonary former smoker,    Pulmonary exam normal breath sounds clear to auscultation       Cardiovascular Exercise Tolerance: Poor hypertension, (-) angina+ DOE   Rhythm:regular Rate:Normal + Systolic murmurs    Neuro/Psych  Neuromuscular disease (Sciatica)    GI/Hepatic neg GERD  ,  Endo/Other  Hypothyroidism Morbid obesity Gout  Renal/GU CRFRenal disease     Musculoskeletal  (+) Arthritis ,   Abdominal (+) + obese (BMI 44),   Peds  Hematology   Anesthesia Other Findings   Reproductive/Obstetrics                             Anesthesia Physical  Anesthesia Plan  ASA: III  Anesthesia Plan: MAC   Post-op Pain Management:    Induction:   PONV Risk Score and Plan: 2 and Treatment may vary due to age or medical condition, TIVA and Midazolam  Airway Management Planned:   Additional Equipment:   Intra-op Plan:   Post-operative Plan:   Informed Consent: I have reviewed the patients History and Physical, chart, labs and discussed the procedure including the risks, benefits and alternatives for the proposed anesthesia with the patient or authorized representative who has indicated his/her understanding and acceptance.     Dental Advisory Given  Plan Discussed with: CRNA  Anesthesia Plan Comments:         Anesthesia Quick Evaluation

## 2019-12-30 NOTE — H&P (Signed)
[facility]   Primary Care Physician:  Mar Daring, PA-C Ophthalmologist: Dr. George Ina  Pre-Procedure History & Physical: HPI:  Sherry Long is a 79 y.o. female here for cataract surgery.   Past Medical History:  Diagnosis Date   Arthritis    lumbar spine   Chronic kidney disease (CKD), stage IV (severe) (HCC)    Gout    Hypertension    Hypothyroidism    Morbid obesity ([place])    Sciatica    right   Wears dentures    full upper    Past Surgical History:  Procedure Laterality Date   CATARACT EXTRACTION W/PHACO Right 12/09/2019   Procedure: CATARACT EXTRACTION PHACO AND INTRAOCULAR LENS PLACEMENT (IOC) RIGHT 4.79 00:37.7;  Surgeon: Birder Robson, MD;  Location: [place];  Service: Ophthalmology;  Laterality: Right;   COLONOSCOPY     TONSILLECTOMY AND ADENOIDECTOMY  1948    Prior to Admission medications   Medication Sig Start Date End Date Taking? Authorizing Provider  acetaminophen (TYLENOL) 500 MG tablet Take 500 mg by mouth as needed.   Yes [provider]  allopurinol (ZYLOPRIM) 100 MG tablet TAKE 2 TABLETS BY MOUTH EVERY DAY 12/29/19  Yes Mar Daring, PA-C  Ascorbic Acid (VITAMIN C PO) Take by mouth daily.   Yes [provider]  aspirin 81 MG tablet Take 81 mg by mouth daily.  04/25/10  Yes [provider]  Biotin 1 MG CAPS Take 1 mg by mouth daily. Unsure dose    Yes [provider]  furosemide (LASIX) 40 MG tablet TAKE 1 TABLET BY MOUTH DAILY Patient taking differently: every other day.  11/25/19  Yes Mar Daring, PA-C  Iron-Vitamins (GERITOL COMPLETE PO) Take by mouth 2 (two) times a week.   Yes [provider]  ketoconazole (NIZORAL) 2 % cream Apply 1 application topically daily as needed.  11/21/12  Yes [provider]  levothyroxine (SYNTHROID) 137 MCG tablet TAKE 1 TABLET BY MOUTH DAILY 12/04/19  Yes Fenton Malling M, PA-C  losartan (COZAAR) 25 MG  tablet Take 1 tablet (25 mg total) by mouth daily. 10/15/19  Yes Mar Daring, PA-C  metoprolol tartrate (LOPRESSOR) 25 MG tablet TAKE 1 TABLET (25 MG) BY MOUTH IN THE MORNING AND TAKE 2 TABLETS (50 MG) BY MOUTH IN THE EVENING Patient taking differently: Take 25 mg by mouth 2 (two) times daily.  01/24/19  Yes Mar Daring, PA-C    Allergies as of 12/12/2019 - Review Complete 12/09/2019  Allergen Reaction Noted   Aspirin Nausea Only 12/11/2014   Hydrochlorothiazide Other (See Comments) 06/23/2015    Family History  Problem Relation Age of Onset   Heart disease Mother    Alzheimer's disease Mother    Stroke Father    Hypertension Sister    Heart disease Sister    Heart disease Maternal Grandmother    Stroke Maternal Grandmother    Colon cancer Other     Social History   Socioeconomic History   Marital status: Widowed    Spouse name: Not on file   Number of children: 1   Years of education: Not on file   Highest education level: Associate degree: occupational, Hotel manager, or vocational program  Occupational History   Occupation: retired  Tobacco Use   Smoking status: Former Smoker    Types: Cigarettes    Quit date: 02/06/1976    Years since quitting: 43.9   Smokeless tobacco: Never Used   Tobacco comment: quit  in 20's  Vaping Use   Vaping Use: Never used  Substance and Sexual Activity   Alcohol use: No   Drug use: No   Sexual activity: Not on file  Other Topics Concern   Not on file  Social History Narrative   1 son - deceased   Social Determinants of Health   Financial Resource Strain: Low Risk    Difficulty of Paying Living Expenses: Not hard at all  Food Insecurity: No Food Insecurity   Worried About Charity fundraiser in the Last Year: Never true   [place] in the Last Year: Never true  Transportation Needs: No Transportation Needs   Lack of Transportation (Medical): No   Lack of Transportation  (Non-Medical): No  Physical Activity: Inactive   Days of Exercise per Week: 0 days   Minutes of Exercise per Session: 0 min  Stress: No Stress Concern Present   Feeling of Stress : Not at all  Social Connections: Moderately Isolated   Frequency of Communication with Friends and Family: More than three times a week   Frequency of Social Gatherings with Friends and Family: More than three times a week   Attends Religious Services: More than 4 times per year   Active Member of [employer] or Organizations: No   Attends Archivist Meetings: Never   Marital Status: Widowed  Human resources officer Violence: Not At Risk   Fear of Current or Ex-Partner: No   Emotionally Abused: No   Physically Abused: No   Sexually Abused: No    Review of Systems: See HPI, otherwise negative ROS  Physical Exam: BP (!) 176/84    Pulse (!) 58    Temp (!) 97.4 F (36.3 C) (Temporal)    Resp 16    Ht 5\' 6"  (1.676 m)    Wt 122.8 kg    SpO2 97%    BMI 43.71 kg/m  General:   Alert,  pleasant and cooperative in NAD Head:  Normocephalic and atraumatic. Respiratory:  Normal work of breathing. Heart:  Regular rate and rhythm.   Impression/Plan: Sherry Long is here for cataract surgery.  Risks, benefits, limitations, and alternatives regarding cataract surgery have been reviewed with the patient.  Questions have been answered.  All parties agreeable.   Birder Robson, MD  12/30/2019, 11:00 AM

## 2019-12-30 NOTE — Anesthesia Procedure Notes (Signed)
Procedure Name: MAC Date/Time: 12/30/2019 11:10 AM Performed by: Cameron Ali, CRNA Pre-anesthesia Checklist: Patient identified, Emergency Drugs available, Suction available, Timeout performed and Patient being monitored Patient Re-evaluated:Patient Re-evaluated prior to induction Oxygen Delivery Method: Nasal cannula Placement Confirmation: positive ETCO2

## 2019-12-30 NOTE — Anesthesia Postprocedure Evaluation (Signed)
Anesthesia Post Note  Patient: Sherry Long  Procedure(s) Performed: CATARACT EXTRACTION PHACO AND INTRAOCULAR LENS PLACEMENT (IOC) LEFT 5.75 00:35.6  (Left Eye)     Patient location during evaluation: PACU Anesthesia Type: MAC Level of consciousness: awake and alert Pain management: pain level controlled Vital Signs Assessment: post-procedure vital signs reviewed and stable Respiratory status: spontaneous breathing, nonlabored ventilation, respiratory function stable and patient connected to nasal cannula oxygen Cardiovascular status: stable and blood pressure returned to baseline Postop Assessment: no apparent nausea or vomiting Anesthetic complications: no   No complications documented.  Darnesha Diloreto A  Aalia Greulich

## 2019-12-30 NOTE — Transfer of Care (Signed)
Immediate Anesthesia Transfer of Care Note  Patient: Sherry Long  Procedure(s) Performed: CATARACT EXTRACTION PHACO AND INTRAOCULAR LENS PLACEMENT (IOC) LEFT 5.75 00:35.6  (Left Eye)  Patient Location: PACU  Anesthesia Type: No value filed.  Level of Consciousness: awake, alert  and patient cooperative  Airway and Oxygen Therapy: Patient Spontanous Breathing and Patient connected to supplemental oxygen  Post-op Assessment: Post-op Vital signs reviewed, Patient's Cardiovascular Status Stable, Respiratory Function Stable, Patent Airway and No signs of Nausea or vomiting  Post-op Vital Signs: Reviewed and stable  Complications: No complications documented.

## 2019-12-30 NOTE — Progress Notes (Signed)
Medication refills

## 2019-12-30 NOTE — Op Note (Signed)
PREOPERATIVE DIAGNOSIS:  Nuclear sclerotic cataract of the left eye.   POSTOPERATIVE DIAGNOSIS:  Nuclear sclerotic cataract of the left eye.   OPERATIVE PROCEDURE:@   SURGEON:  Birder Robson, MD.   ANESTHESIA:  Anesthesiologist: Heniser, Fredric Dine, MD CRNA: Cameron Ali, CRNA  1.      Managed anesthesia care. 2.     0.46ml of Shugarcaine was instilled following the paracentesis   COMPLICATIONS:  None.   TECHNIQUE:   Stop and chop   DESCRIPTION OF PROCEDURE:  The patient was examined and consented in the preoperative holding area where the aforementioned topical anesthesia was applied to the left eye and then brought back to the Operating Room where the left eye was prepped and draped in the usual sterile ophthalmic fashion and a lid speculum was placed. A paracentesis was created with the side port blade and the anterior chamber was filled with viscoelastic. A near clear corneal incision was performed with the steel keratome. A continuous curvilinear capsulorrhexis was performed with a cystotome followed by the capsulorrhexis forceps. Hydrodissection and hydrodelineation were carried out with BSS on a blunt cannula. The lens was removed in a stop and chop  technique and the remaining cortical material was removed with the irrigation-aspiration handpiece. The capsular bag was inflated with viscoelastic and the Technis ZCB00 lens was placed in the capsular bag without complication. The remaining viscoelastic was removed from the eye with the irrigation-aspiration handpiece. The wounds were hydrated. The anterior chamber was flushed with BSS and the eye was inflated to physiologic pressure. 0.108ml Vigamox was placed in the anterior chamber. The wounds were found to be water tight. The eye was dressed with Combigan. The patient was given protective glasses to wear throughout the day and a shield with which to sleep tonight. The patient was also given drops with which to begin a drop regimen today and  will follow-up with me in one day. Implant Name Type Inv. Item Serial No. Manufacturer Lot No. LRB No. Used Action  LENS IOL TECNIS EYHANCE 19.5 - [id number] Intraocular Lens LENS IOL TECNIS EYHANCE 19.5 [id number] JOHNSON   Left 1 Implanted    Procedure(s): CATARACT EXTRACTION PHACO AND INTRAOCULAR LENS PLACEMENT (IOC) LEFT 5.75 00:35.6  (Left)  Electronically signed: Birder Robson 12/30/2019 11:26 AM

## 2020-03-11 DIAGNOSIS — D631 Anemia in chronic kidney disease: Secondary | ICD-10-CM | POA: Diagnosis not present

## 2020-03-11 DIAGNOSIS — N184 Chronic kidney disease, stage 4 (severe): Secondary | ICD-10-CM | POA: Diagnosis not present

## 2020-04-22 ENCOUNTER — Ambulatory Visit: Payer: Medicare Other | Admitting: Cardiology

## 2020-04-30 ENCOUNTER — Ambulatory Visit: Payer: Medicare Other | Admitting: Cardiology

## 2020-04-30 ENCOUNTER — Other Ambulatory Visit: Payer: Self-pay

## 2020-04-30 ENCOUNTER — Encounter: Payer: Self-pay | Admitting: Cardiology

## 2020-04-30 VITALS — BP 130/80 | HR 56 | Ht 66.0 in | Wt 267.0 lb

## 2020-04-30 DIAGNOSIS — R609 Edema, unspecified: Secondary | ICD-10-CM

## 2020-04-30 DIAGNOSIS — R600 Localized edema: Secondary | ICD-10-CM | POA: Diagnosis not present

## 2020-04-30 DIAGNOSIS — R011 Cardiac murmur, unspecified: Secondary | ICD-10-CM | POA: Diagnosis not present

## 2020-04-30 DIAGNOSIS — I10 Essential (primary) hypertension: Secondary | ICD-10-CM | POA: Diagnosis not present

## 2020-04-30 MED ORDER — FUROSEMIDE 40 MG PO TABS: 40.0000 mg | ORAL_TABLET | Freq: Every day | ORAL | 3 refills | Status: DC

## 2020-04-30 NOTE — Progress Notes (Signed)
Cardiology Office Note:    Date:  04/30/2020   ID:  CULLEN GILCHRIST, DOB Sep 27, 1940, MRN [id number]  PCP:  Rubye Beach   [place]  Cardiologist:  No primary care provider on file.  Advanced Practice Provider:  No care team member to display Electrophysiologist:  None       Referring MD: Florian Buff*   Chief Complaint  Patient presents with   New Patient (Initial Visit)    Self referral - Patient wants to establish care. Patient c.o swelling in ankles, SOB on exertion. Meds reviewed verbally with patient.     History of Present Illness:    Sherry Long is a 80 y.o. female with a hx of hypertension, CKD stage IV who presents due to dyspnea on exertion and lower extremity edema.  Patient complains of worsening lower extremity edema over the past 2 months.  Also endorses shortness of breath with exertion.  Was started on Lasix which she took every other day, initially with improvement in symptoms but this has since stopped working.  Denies any history of heart disease.  Has a history of CKD, sees nephrology.  Overdiuresis was being avoided to prevent worsening kidney function.  She denies chest pain, but states having shortness of breath with exertion.  Past Medical History:  Diagnosis Date   Arthritis    lumbar spine   Chronic kidney disease (CKD), stage IV (severe) (HCC)    Gout    Hypertension    Hypothyroidism    Morbid obesity ([place])    Sciatica    right   Wears dentures    full upper    Past Surgical History:  Procedure Laterality Date   CATARACT EXTRACTION W/PHACO Right 12/09/2019   Procedure: CATARACT EXTRACTION PHACO AND INTRAOCULAR LENS PLACEMENT (IOC) RIGHT 4.79 00:37.7;  Surgeon: Birder Robson, MD;  Location: [facility];  Service: Ophthalmology;  Laterality: Right;   CATARACT EXTRACTION W/PHACO Left 12/30/2019   Procedure: CATARACT EXTRACTION PHACO AND INTRAOCULAR LENS PLACEMENT (IOC)  LEFT 5.75 00:35.6 ;  Surgeon: Birder Robson, MD;  Location: [facility];  Service: Ophthalmology;  Laterality: Left;   COLONOSCOPY     TONSILLECTOMY AND ADENOIDECTOMY  1948    Current Medications: Current Meds  Medication Sig   acetaminophen (TYLENOL) 500 MG tablet Take 500 mg by mouth as needed.   allopurinol (ZYLOPRIM) 100 MG tablet Take 100 mg by mouth daily.   Ascorbic Acid (VITAMIN C PO) Take by mouth daily.   aspirin 81 MG tablet Take 81 mg by mouth daily.    ketoconazole (NIZORAL) 2 % cream Apply 1 application topically daily as needed.    levothyroxine (SYNTHROID) 137 MCG tablet TAKE 1 TABLET BY MOUTH DAILY   losartan (COZAAR) 25 MG tablet Take 1 tablet (25 mg total) by mouth daily.   metoprolol tartrate (LOPRESSOR) 25 MG tablet Take 1 tablet (25 mg total) by mouth 2 (two) times daily.   Multiple Vitamins-Iron (MULTIVITAMIN PLUS IRON ADULT PO) Take by mouth daily.   [DISCONTINUED] furosemide (LASIX) 40 MG tablet Take 1 tablet (40 mg total) by mouth every other day.     Allergies:   Aspirin and Hydrochlorothiazide   Social History   Socioeconomic History   Marital status: Widowed    Spouse name: Not on file   Number of children: 1   Years of education: Not on file   Highest education level: Associate degree: occupational, Hotel manager, or vocational program  Occupational  History   Occupation: retired  Tobacco Use   Smoking status: Former Smoker    Types: Cigarettes    Quit date: 02/06/1976    Years since quitting: 44.2   Smokeless tobacco: Never Used   Tobacco comment: quit in 20's  Vaping Use   Vaping Use: Never used  Substance and Sexual Activity   Alcohol use: No   Drug use: No   Sexual activity: Not on file  Other Topics Concern   Not on file  Social History Narrative   1 son - deceased   Social Determinants of Health   Financial Resource Strain: Low Risk    Difficulty of Paying Living Expenses: Not hard at all  Food  Insecurity: No Food Insecurity   Worried About Charity fundraiser in the Last Year: Never true   [place] in the Last Year: Never true  Transportation Needs: No Transportation Needs   Lack of Transportation (Medical): No   Lack of Transportation (Non-Medical): No  Physical Activity: Inactive   Days of Exercise per Week: 0 days   Minutes of Exercise per Session: 0 min  Stress: No Stress Concern Present   Feeling of Stress : Not at all  Social Connections: Moderately Isolated   Frequency of Communication with Friends and Family: More than three times a week   Frequency of Social Gatherings with Friends and Family: More than three times a week   Attends Religious Services: More than 4 times per year   Active Member of [employer] or Organizations: No   Attends Archivist Meetings: Never   Marital Status: Widowed     Family History: The patient's family history includes Alzheimer's disease in her mother; Colon cancer in an other family member; Heart disease in her maternal grandmother, mother, and sister; Hypertension in her sister; Stroke in her father and maternal grandmother.  ROS:   Please see the history of present illness.     All other systems reviewed and are negative.  EKGs/Labs/Other Studies Reviewed:    The following studies were reviewed today:   EKG:  EKG is  ordered today.  The ekg ordered today demonstrates sinus tachycardia, right bundle branch block.  Recent Labs: 09/18/2019: ALT 8; TSH 1.310 12/19/2019: BUN 25; Creatinine, Ser 2.33; Hemoglobin 10.9; Platelets 256; Potassium 4.8; Sodium 133  Recent Lipid Panel    Component Value Date/Time   CHOL 150 09/18/2019 1053   TRIG 70 09/18/2019 1053   HDL 72 09/18/2019 1053   CHOLHDL 2.1 09/09/2018 1135   LDLCALC 64 09/18/2019 1053     Risk Assessment/Calculations:      Physical Exam:    VS:  BP 130/80 (BP Location: Left Arm, Patient Position: Sitting, Cuff Size: Normal)    Pulse (!)  56    Ht '5\' 6"'$  (1.676 m)    Wt 267 lb (121.1 kg)    SpO2 93%    BMI 43.09 kg/m     Wt Readings from Last 3 Encounters:  04/30/20 267 lb (121.1 kg)  12/30/19 270 lb 12.8 oz (122.8 kg)  12/09/19 270 lb (122.5 kg)     GEN:  Well nourished, well developed in no acute distress HEENT: Normal NECK: No JVD; No carotid bruits LYMPHATICS: No lymphadenopathy CARDIAC: RRR, 2/6 systolic murmur, left lower sternal border RESPIRATORY:  Clear to auscultation without rales, wheezing or rhonchi  ABDOMEN: Soft, non-tender, non-distended MUSCULOSKELETAL:  No edema; No deformity  SKIN: Warm and dry NEUROLOGIC:  Alert and oriented x  3 PSYCHIATRIC:  Normal affect   ASSESSMENT:    1. Leg edema   2. Primary hypertension   3. Morbid obesity ([place])   4. Systolic murmur   5. Edema, unspecified type    PLAN:    In order of problems listed above:  1. Bilateral lower extremity edema, obtain echocardiogram to evaluate systolic and diastolic function, increase Lasix to 40 mg daily.  CKD could be contributing. 2. Hypertension, BP controlled.  Lasix and Lopressor as prescribed. 3. Morbid obesity, low-calorie diet advised. 4. Systolic murmur noted on exam, obtain echocardiogram as above to evaluate valvular pathology.  Follow-up after echocardiogram.   Medication Adjustments/Labs and Tests Ordered: Current medicines are reviewed at length with the patient today.  Concerns regarding medicines are outlined above.  Orders Placed This Encounter  Procedures   EKG 12-Lead   ECHOCARDIOGRAM COMPLETE   Meds ordered this encounter  Medications   furosemide (LASIX) 40 MG tablet    Sig: Take 1 tablet (40 mg total) by mouth daily.    Dispense:  45 tablet    Refill:  3    Patient Instructions  Medication Instructions:   1. Take lasix one tablet ('40MG'$ ) by mouth once a day.   *If you need a refill on your cardiac medications before your next appointment, please call your pharmacy*   Lab Work:  None  ordered    Testing/Procedures:  1.  Your physician has requested that you have an echocardiogram. Echocardiography is a painless test that uses sound waves to create images of your heart. It provides your doctor with information about the size and shape of your heart and how well your hearts chambers and valves are working. This procedure takes approximately one hour. There are no restrictions for this procedure.     Follow-Up: At [facility], you and your health needs are our priority.  As part of our continuing mission to provide you with exceptional heart care, we have created designated Provider Care Teams.  These Care Teams include your primary Cardiologist (physician) and Advanced Practice Providers (APPs -  Physician Assistants and Nurse Practitioners) who all work together to provide you with the care you need, when you need it.  We recommend signing up for the patient portal called "MyChart".  Sign up information is provided on this After Visit Summary.  MyChart is used to connect with patients for Virtual Visits (Telemedicine).  Patients are able to view lab/test results, encounter notes, upcoming appointments, etc.  Non-urgent messages can be sent to your provider as well.   To learn more about what you can do with MyChart, go to [email or web address].    Your next appointment:   Follow up after Echo   The format for your next appointment:   In Person  Provider:   Kate Sable, MD   Other Instructions       Signed, Kate Sable, MD  04/30/2020 1:03 PM    [place]

## 2020-04-30 NOTE — Patient Instructions (Addendum)
Medication Instructions:   1. Take lasix one tablet ('40MG'$ ) by mouth once a day.   *If you need a refill on your cardiac medications before your next appointment, please call your pharmacy*   Lab Work:  None ordered    Testing/Procedures:  1.  Your physician has requested that you have an echocardiogram. Echocardiography is a painless test that uses sound waves to create images of your heart. It provides your doctor with information about the size and shape of your heart and how well your hearts chambers and valves are working. This procedure takes approximately one hour. There are no restrictions for this procedure.     Follow-Up: At [facility], you and your health needs are our priority.  As part of our continuing mission to provide you with exceptional heart care, we have created designated Provider Care Teams.  These Care Teams include your primary Cardiologist (physician) and Advanced Practice Providers (APPs -  Physician Assistants and Nurse Practitioners) who all work together to provide you with the care you need, when you need it.  We recommend signing up for the patient portal called "MyChart".  Sign up information is provided on this After Visit Summary.  MyChart is used to connect with patients for Virtual Visits (Telemedicine).  Patients are able to view lab/test results, encounter notes, upcoming appointments, etc.  Non-urgent messages can be sent to your provider as well.   To learn more about what you can do with MyChart, go to [email or web address].    Your next appointment:   Follow up after Echo   The format for your next appointment:   In Person  Provider:   Kate Sable, MD   Other Instructions

## 2020-05-10 ENCOUNTER — Other Ambulatory Visit: Payer: Self-pay | Admitting: Physician Assistant

## 2020-05-10 NOTE — Telephone Encounter (Signed)
Requested medication (s) are due for refill today: historical med  Requested medication (s) are on the active medication list: yes  Last refill:  na  Future visit scheduled: no  Notes to clinic:  historical med, do you want to renew Rx?     Requested Prescriptions  Pending Prescriptions Disp Refills   allopurinol (ZYLOPRIM) 100 MG tablet [Pharmacy Med Name: ALLOPURINOL 100 MG TAB] 180 tablet     Sig: TAKE 2 TABLETS BY [place] DAY      Endocrinology:  Gout Agents Failed - 05/10/2020  4:54 PM      Failed - Uric Acid in normal range and within 360 days    No results found for: POCURA, LABURIC        Failed - Cr in normal range and within 360 days    Creatinine, Ser  Date Value Ref Range Status  12/19/2019 2.33 (H) 0.57 - 1.00 mg/dL Final          Passed - Valid encounter within last 12 months    Recent Outpatient Visits           7 months ago Annual physical exam   [facility] [place], Clearnce Sorrel, [place]   1 year ago Annual physical exam   [place], Clearnce Sorrel, [place]   2 years ago Essential hypertension   [place], Clearnce Sorrel, [place]   3 years ago Annual physical exam   [facility] [place], [place]   4 years ago Medicare annual wellness visit, subsequent   [place], [place]       Future Appointments             In 2 weeks [place], Aaron Edelman, MD [facility], [place]

## 2020-05-11 NOTE — Telephone Encounter (Signed)
No uric acid in 2+ years

## 2020-05-26 ENCOUNTER — Other Ambulatory Visit: Payer: Self-pay

## 2020-05-26 ENCOUNTER — Ambulatory Visit (INDEPENDENT_AMBULATORY_CARE_PROVIDER_SITE_OTHER): Payer: Medicare Other

## 2020-05-26 DIAGNOSIS — R011 Cardiac murmur, unspecified: Secondary | ICD-10-CM

## 2020-05-26 DIAGNOSIS — R600 Localized edema: Secondary | ICD-10-CM | POA: Diagnosis not present

## 2020-05-26 LAB — ECHOCARDIOGRAM COMPLETE
AV Mean grad: 11 mmHg
AV Peak grad: 16.8 mmHg
Ao pk vel: 2.05 m/s
Area-P 1/2: 2.94 cm2
Calc EF: 61 %
P 1/2 time: 507 msec
Single Plane A2C EF: 66.2 %
Single Plane A4C EF: 52.9 %

## 2020-05-28 ENCOUNTER — Encounter: Payer: Self-pay | Admitting: Cardiology

## 2020-05-28 ENCOUNTER — Ambulatory Visit (INDEPENDENT_AMBULATORY_CARE_PROVIDER_SITE_OTHER): Payer: Medicare Other | Admitting: Cardiology

## 2020-05-28 ENCOUNTER — Other Ambulatory Visit: Payer: Self-pay

## 2020-05-28 VITALS — BP 144/66 | HR 46 | Ht 66.0 in | Wt 264.0 lb

## 2020-05-28 DIAGNOSIS — I5189 Other ill-defined heart diseases: Secondary | ICD-10-CM

## 2020-05-28 DIAGNOSIS — I517 Cardiomegaly: Secondary | ICD-10-CM | POA: Diagnosis not present

## 2020-05-28 DIAGNOSIS — I10 Essential (primary) hypertension: Secondary | ICD-10-CM | POA: Diagnosis not present

## 2020-05-28 MED ORDER — FUROSEMIDE 40 MG PO TABS: 40.0000 mg | ORAL_TABLET | Freq: Every day | ORAL | 3 refills | Status: DC

## 2020-05-28 NOTE — Patient Instructions (Signed)
Medication Instructions:   Your physician recommends that you continue on your current medications as directed. Please refer to the Current Medication list given to you today.  *If you need a refill on your cardiac medications before your next appointment, please call your pharmacy*   Lab Work:  BMP to be drawn today.  If you have labs (blood work) drawn today and your tests are completely normal, you will receive your results only by: Marland Kitchen MyChart Message (if you have MyChart) OR . A paper copy in the mail If you have any lab test that is abnormal or we need to change your treatment, we will call you to review the results.   Testing/Procedures:  None ordered    Follow-Up: At [facility], you and your health needs are our priority.  As part of our continuing mission to provide you with exceptional heart care, we have created designated Provider Care Teams.  These Care Teams include your primary Cardiologist (physician) and Advanced Practice Providers (APPs -  Physician Assistants and Nurse Practitioners) who all work together to provide you with the care you need, when you need it.  We recommend signing up for the patient portal called "MyChart".  Sign up information is provided on this After Visit Summary.  MyChart is used to connect with patients for Virtual Visits (Telemedicine).  Patients are able to view lab/test results, encounter notes, upcoming appointments, etc.  Non-urgent messages can be sent to your provider as well.   To learn more about what you can do with MyChart, go to [email or web address].    Your next appointment:   6 week(s)  The format for your next appointment:   In Person  Provider:   Kate Sable, MD   Other Instructions

## 2020-05-28 NOTE — Progress Notes (Signed)
Cardiology Office Note:    Date:  05/28/2020   ID:  Sherry Long, DOB 07/24/1940, MRN [id number]  PCP:  Rubye Beach   [place]  Cardiologist:  No primary care provider on file.  Advanced Practice Provider:  No care team member to display Electrophysiologist:  None       Referring MD: Florian Buff*   Chief Complaint  Patient presents with  . Follow-up    After echo    History of Present Illness:    Sherry Long is a 80 y.o. female with a hx of hypertension, CKD stage IV who presents for follow-up.  She was last seen due to systolic murmur, dyspnea on exertion and lower extremity edema.  Echocardiogram was ordered to evaluate cardiac function, due to edema and systolic murmur.  Lasix was increased to 40 mg daily.  She states edema has improved overall.  She used to be over 400 pounds, has slowly and steadily lost weight over the years.  Her mother and grandmother had heart conditions which she is not sure about.  Her sister has a pacemaker.  Prior notes .  Has a history of CKD, sees nephrology.  Past Medical History:  Diagnosis Date  . Arthritis    lumbar spine  . Chronic kidney disease (CKD), stage IV (severe) ([place])   . Gout   . Hypertension   . Hypothyroidism   . Morbid obesity ([place])   . Sciatica    right  . Wears dentures    full upper    Past Surgical History:  Procedure Laterality Date  . CATARACT EXTRACTION W/PHACO Right 12/09/2019   Procedure: CATARACT EXTRACTION PHACO AND INTRAOCULAR LENS PLACEMENT (IOC) RIGHT 4.79 00:37.7;  Surgeon: Birder Robson, MD;  Location: [facility];  Service: Ophthalmology;  Laterality: Right;  . CATARACT EXTRACTION W/PHACO Left 12/30/2019   Procedure: CATARACT EXTRACTION PHACO AND INTRAOCULAR LENS PLACEMENT (IOC) LEFT 5.75 00:35.6 ;  Surgeon: Birder Robson, MD;  Location: [facility];  Service: Ophthalmology;  Laterality: Left;  . COLONOSCOPY    .  TONSILLECTOMY AND ADENOIDECTOMY  1948    Current Medications: Current Meds  Medication Sig  . acetaminophen (TYLENOL) 500 MG tablet Take 500 mg by mouth as needed.  Marland Kitchen allopurinol (ZYLOPRIM) 100 MG tablet TAKE 2 TABLETS BY MOUTH EVERY DAY  . Ascorbic Acid (VITAMIN C PO) Take by mouth daily.  Marland Kitchen aspirin 81 MG tablet Take 81 mg by mouth daily.   Marland Kitchen ketoconazole (NIZORAL) 2 % cream Apply 1 application topically daily as needed.   Marland Kitchen levothyroxine (SYNTHROID) 137 MCG tablet TAKE 1 TABLET BY MOUTH DAILY  . losartan (COZAAR) 25 MG tablet Take 1 tablet (25 mg total) by mouth daily.  . metoprolol tartrate (LOPRESSOR) 25 MG tablet Take 1 tablet (25 mg total) by mouth 2 (two) times daily.  . Multiple Vitamins-Iron (MULTIVITAMIN PLUS IRON ADULT PO) Take by mouth daily.  . [DISCONTINUED] furosemide (LASIX) 40 MG tablet Take 1 tablet (40 mg total) by mouth daily.     Allergies:   Aspirin and Hydrochlorothiazide   Social History   Socioeconomic History  . Marital status: Widowed    Spouse name: Not on file  . Number of children: 1  . Years of education: Not on file  . Highest education level: Associate degree: occupational, Hotel manager, or vocational program  Occupational History  . Occupation: retired  Tobacco Use  . Smoking status: Former Smoker    Types:  Cigarettes    Quit date: 02/06/1976    Years since quitting: 44.3  . Smokeless tobacco: Never Used  . Tobacco comment: quit in 20's  Vaping Use  . Vaping Use: Never used  Substance and Sexual Activity  . Alcohol use: No  . Drug use: No  . Sexual activity: Not on file  Other Topics Concern  . Not on file  Social History Narrative   1 son - deceased   Social Determinants of Health   Financial Resource Strain: Low Risk   . Difficulty of Paying Living Expenses: Not hard at all  Food Insecurity: No Food Insecurity  . Worried About Charity fundraiser in the Last Year: Never true  . Ran Out of Food in the Last Year: Never true   Transportation Needs: No Transportation Needs  . Lack of Transportation (Medical): No  . Lack of Transportation (Non-Medical): No  Physical Activity: Inactive  . Days of Exercise per Week: 0 days  . Minutes of Exercise per Session: 0 min  Stress: No Stress Concern Present  . Feeling of Stress : Not at all  Social Connections: Moderately Isolated  . Frequency of Communication with Friends and Family: More than three times a week  . Frequency of Social Gatherings with Friends and Family: More than three times a week  . Attends Religious Services: More than 4 times per year  . Active Member of Clubs or Organizations: No  . Attends Archivist Meetings: Never  . Marital Status: Widowed     Family History: The patient's family history includes Alzheimer's disease in her mother; Colon cancer in an other family member; Heart disease in her maternal grandmother, mother, and sister; Hypertension in her sister; Stroke in her father and maternal grandmother.  ROS:   Please see the history of present illness.     All other systems reviewed and are negative.  EKGs/Labs/Other Studies Reviewed:    The following studies were reviewed today:   EKG:  EKG not ordered today.    Recent Labs: 09/18/2019: ALT 8; TSH 1.310 12/19/2019: BUN 25; Creatinine, Ser 2.33; Hemoglobin 10.9; Platelets 256; Potassium 4.8; Sodium 133  Recent Lipid Panel    Component Value Date/Time   CHOL 150 09/18/2019 1053   TRIG 70 09/18/2019 1053   HDL 72 09/18/2019 1053   CHOLHDL 2.1 09/09/2018 1135   LDLCALC 64 09/18/2019 1053     Risk Assessment/Calculations:      Physical Exam:    VS:  BP (!) 144/66   Pulse (!) 46   Ht '5\' 6"'$  (1.676 m)   Wt 264 lb (119.7 kg)   BMI 42.61 kg/m     Wt Readings from Last 3 Encounters:  05/28/20 264 lb (119.7 kg)  04/30/20 267 lb (121.1 kg)  12/30/19 270 lb 12.8 oz (122.8 kg)     GEN:  Well nourished, well developed in no acute distress HEENT: Normal NECK: No  JVD; No carotid bruits LYMPHATICS: No lymphadenopathy CARDIAC: RRR, 2/6 systolic murmur, left lower sternal border RESPIRATORY:  Clear to auscultation without rales, wheezing or rhonchi  ABDOMEN: Soft, non-tender, non-distended MUSCULOSKELETAL:  2+ edema; No deformity  SKIN: Warm and dry NEUROLOGIC:  Alert and oriented x 3 PSYCHIATRIC:  Normal affect   ASSESSMENT:    1. Diastolic dysfunction   2. Primary hypertension   3. Morbid obesity ([place])   4. LVH (left ventricular hypertrophy)    PLAN:    In order of problems listed above:  1.  Grade 2 diastolic dysfunction, bilateral lower extremity edema, improved with Lasix.  Continue Lasix 40 mg daily, get BMP today. 2. Hypertension, BP controlled.  Lasix and Lopressor as prescribed. 3. Morbid obesity, low-calorie diet advised. 4. Asymmetric septal hypertrophy, basal septum measuring up to 1.5 cm, LVOT gradient 22 mm.  Suggesting HCM.  Patient has no children, recommend brothers/sisters get screened.  We discussed obtaining CMR, current treatment for HCM if diagnosed.  Patient declined which I think is reasonable.   Follow-up in 4 to 6 weeks   Medication Adjustments/Labs and Tests Ordered: Current medicines are reviewed at length with the patient today.  Concerns regarding medicines are outlined above.  Orders Placed This Encounter  Procedures  . Basic metabolic panel   Meds ordered this encounter  Medications  . furosemide (LASIX) 40 MG tablet    Sig: Take 1 tablet (40 mg total) by mouth daily.    Dispense:  90 tablet    Refill:  3    Patient Instructions  Medication Instructions:   Your physician recommends that you continue on your current medications as directed. Please refer to the Current Medication list given to you today.  *If you need a refill on your cardiac medications before your next appointment, please call your pharmacy*   Lab Work:  BMP to be drawn today.  If you have labs (blood work) drawn today and  your tests are completely normal, you will receive your results only by: Marland Kitchen MyChart Message (if you have MyChart) OR . A paper copy in the mail If you have any lab test that is abnormal or we need to change your treatment, we will call you to review the results.   Testing/Procedures:  None ordered    Follow-Up: At [facility], you and your health needs are our priority.  As part of our continuing mission to provide you with exceptional heart care, we have created designated Provider Care Teams.  These Care Teams include your primary Cardiologist (physician) and Advanced Practice Providers (APPs -  Physician Assistants and Nurse Practitioners) who all work together to provide you with the care you need, when you need it.  We recommend signing up for the patient portal called "MyChart".  Sign up information is provided on this After Visit Summary.  MyChart is used to connect with patients for Virtual Visits (Telemedicine).  Patients are able to view lab/test results, encounter notes, upcoming appointments, etc.  Non-urgent messages can be sent to your provider as well.   To learn more about what you can do with MyChart, go to [email or web address].    Your next appointment:   6 week(s)  The format for your next appointment:   In Person  Provider:   Kate Sable, MD   Other Instructions      Signed, Kate Sable, MD  05/28/2020 12:56 PM    [place]

## 2020-05-29 LAB — BASIC METABOLIC PANEL
BUN/Creatinine Ratio: 18 (ref 12–28)
BUN: 51 mg/dL — ABNORMAL HIGH (ref 8–27)
CO2: 21 mmol/L (ref 20–29)
Calcium: 9.5 mg/dL (ref 8.7–10.3)
Chloride: 96 mmol/L (ref 96–106)
Creatinine, Ser: 2.83 mg/dL — ABNORMAL HIGH (ref 0.57–1.00)
Glucose: 99 mg/dL (ref 65–99)
Potassium: 4.6 mmol/L (ref 3.5–5.2)
Sodium: 135 mmol/L (ref 134–144)
eGFR: 16 mL/min/{1.73_m2} — ABNORMAL LOW (ref >59)

## 2020-06-03 ENCOUNTER — Telehealth: Payer: Self-pay

## 2020-06-03 DIAGNOSIS — I10 Essential (primary) hypertension: Secondary | ICD-10-CM

## 2020-06-03 NOTE — Telephone Encounter (Signed)
Called patient and gave her the recommendations from Dr. Garen Lah as noted below. Patient stated that she had talked to Dr. Garen Lah when she was here in the office about how tired she was getting and would have to stop and sit many times during the afternoon. She stopped taking her Losartan at the recommendation of her PCP and has been feeling much better. She states she is taking her BP daily and it is remaining in normal range. She agreed that she will notify our office if it becomes elevated.  Creatinine slightly worse compared to 5 months ago, but the same with 5 years ago. Continue Lasix at current dose, repeat BMP in 2 weeks. If creatinine worsens, will reduce Lasix dosage.

## 2020-06-07 ENCOUNTER — Other Ambulatory Visit: Payer: Self-pay | Admitting: Physician Assistant

## 2020-06-07 DIAGNOSIS — E039 Hypothyroidism, unspecified: Secondary | ICD-10-CM

## 2020-06-08 NOTE — Telephone Encounter (Signed)
Requested medication (s) are due for refill today: yes  Requested medication (s) are on the active medication list: yes  Last refill: 03/04/2020  Future visit scheduled: no  Notes to clinic:   TSH needs to be rechecked within 3 months after an abnormal result. Refill until TSH is due  Requested Prescriptions  Pending Prescriptions Disp Refills   levothyroxine (SYNTHROID) 137 MCG tablet [Pharmacy Med Name: LEVOTHYROXINE SODIUM 137 MCG TAB] 90 tablet 1    Sig: TAKE 1 TABLET BY MOUTH DAILY      Endocrinology:  Hypothyroid Agents Failed - 06/07/2020  3:41 PM      Failed - TSH needs to be rechecked within 3 months after an abnormal result. Refill until TSH is due.      Passed - TSH in normal range and within 360 days    TSH  Date Value Ref Range Status  09/18/2019 1.310 0.450 - 4.500 uIU/mL Final          Passed - Valid encounter within last 12 months    Recent Outpatient Visits           8 months ago Annual physical exam   [facility] [place], Clearnce Sorrel, [place]   1 year ago Annual physical exam   [facility] [place], Clearnce Sorrel, [place]   2 years ago Essential hypertension   [place], Clearnce Sorrel, [place]   3 years ago Annual physical exam   [facility] [place] Mar Daring, [place]   4 years ago Medicare annual wellness visit, subsequent   [place], [place]       Future Appointments             In 1 month Agbor-Etang, Aaron Edelman, MD [facility], [place]

## 2020-06-16 ENCOUNTER — Other Ambulatory Visit
Admission: RE | Admit: 2020-06-16 | Discharge: 2020-06-16 | Disposition: A | Discharge status: Home / Self Care | Payer: Medicare Other | Attending: Cardiology | Admitting: Cardiology

## 2020-06-16 DIAGNOSIS — I10 Essential (primary) hypertension: Secondary | ICD-10-CM | POA: Diagnosis not present

## 2020-06-16 LAB — BASIC METABOLIC PANEL
Anion gap: 11 (ref 5–15)
BUN: 53 mg/dL — ABNORMAL HIGH (ref 8–23)
CO2: 26 mmol/L (ref 22–32)
Calcium: 9.5 mg/dL (ref 8.9–10.3)
Chloride: 96 mmol/L — ABNORMAL LOW (ref 98–111)
Creatinine, Ser: 2.76 mg/dL — ABNORMAL HIGH (ref 0.44–1.00)
GFR, Estimated: 17 mL/min — ABNORMAL LOW (ref >60)
Glucose, Bld: 95 mg/dL (ref 70–99)
Potassium: 3.6 mmol/L (ref 3.5–5.1)
Sodium: 133 mmol/L — ABNORMAL LOW (ref 135–145)

## 2020-06-17 ENCOUNTER — Telehealth: Payer: Self-pay | Admitting: *Deleted

## 2020-06-17 NOTE — Telephone Encounter (Signed)
Results reviewed with patient and confirmed upcoming appointment. She verbalized understanding with no further questions at this time.

## 2020-06-17 NOTE — Telephone Encounter (Signed)
Left voicemail message to call back for review of results.  

## 2020-06-17 NOTE — Telephone Encounter (Signed)
-----   Message from Kate Sable, MD sent at 06/16/2020  5:39 PM EDT ----- Creatinine stable, continue Lasix as prescribed.

## 2020-07-16 ENCOUNTER — Ambulatory Visit: Payer: Medicare Other | Admitting: Cardiology

## 2020-08-04 DIAGNOSIS — N2889 Other specified disorders of kidney and ureter: Secondary | ICD-10-CM | POA: Diagnosis not present

## 2020-08-04 DIAGNOSIS — Z862 Personal history of diseases of the blood and blood-forming organs and certain disorders involving the immune mechanism: Secondary | ICD-10-CM | POA: Diagnosis not present

## 2020-08-04 DIAGNOSIS — N189 Chronic kidney disease, unspecified: Secondary | ICD-10-CM | POA: Diagnosis not present

## 2020-08-04 DIAGNOSIS — I151 Hypertension secondary to other renal disorders: Secondary | ICD-10-CM | POA: Diagnosis not present

## 2020-08-04 DIAGNOSIS — N184 Chronic kidney disease, stage 4 (severe): Secondary | ICD-10-CM | POA: Diagnosis not present

## 2020-10-29 DIAGNOSIS — N2889 Other specified disorders of kidney and ureter: Secondary | ICD-10-CM | POA: Diagnosis not present

## 2020-10-29 DIAGNOSIS — I151 Hypertension secondary to other renal disorders: Secondary | ICD-10-CM | POA: Diagnosis not present

## 2020-10-29 DIAGNOSIS — N184 Chronic kidney disease, stage 4 (severe): Secondary | ICD-10-CM | POA: Diagnosis not present

## 2020-11-02 DIAGNOSIS — D631 Anemia in chronic kidney disease: Secondary | ICD-10-CM | POA: Diagnosis not present

## 2020-11-02 DIAGNOSIS — N189 Chronic kidney disease, unspecified: Secondary | ICD-10-CM | POA: Diagnosis not present

## 2020-11-02 DIAGNOSIS — E8770 Fluid overload, unspecified: Secondary | ICD-10-CM | POA: Diagnosis not present

## 2020-11-02 DIAGNOSIS — N184 Chronic kidney disease, stage 4 (severe): Secondary | ICD-10-CM | POA: Diagnosis not present

## 2020-11-02 DIAGNOSIS — M899 Disorder of bone, unspecified: Secondary | ICD-10-CM | POA: Diagnosis not present

## 2020-11-02 DIAGNOSIS — I10 Essential (primary) hypertension: Secondary | ICD-10-CM | POA: Diagnosis not present

## 2020-11-30 ENCOUNTER — Other Ambulatory Visit: Payer: Self-pay | Admitting: Family Medicine

## 2020-11-30 DIAGNOSIS — E039 Hypothyroidism, unspecified: Secondary | ICD-10-CM

## 2020-11-30 NOTE — Telephone Encounter (Signed)
Requested medication (s) are due for refill today: yes  Requested medication (s) are on the active medication list: yes  Last refill:  06/08/20 #90 1 RF  Future visit scheduled: no  Notes to clinic:  called pt and LM on VM to call office to schedule OV- call back number provided   Requested Prescriptions  Pending Prescriptions Disp Refills   levothyroxine (SYNTHROID) 137 MCG tablet [Pharmacy Med Name: LEVOTHYROXINE SODIUM 137 MCG TAB] 90 tablet 1    Sig: TAKE 1 TABLET BY MOUTH DAILY     Endocrinology:  Hypothyroid Agents Failed - 11/30/2020 12:50 PM      Failed - TSH needs to be rechecked within 3 months after an abnormal result. Refill until TSH is due.      Failed - TSH in normal range and within 360 days    TSH  Date Value Ref Range Status  09/18/2019 1.310 0.450 - 4.500 uIU/mL Final          Failed - Valid encounter within last 12 months    Recent Outpatient Visits           1 year ago Annual physical exam   [facility] [place], Clearnce Sorrel, [place]   2 years ago Annual physical exam   [facility] [place], Clearnce Sorrel, [place]   3 years ago Essential hypertension   [place], Clearnce Sorrel, [place]   4 years ago Annual physical exam   [facility] Mar Daring, [place]   5 years ago Medicare annual wellness visit, subsequent   [facility] Margarita Rana, [place]

## 2020-12-17 ENCOUNTER — Telehealth: Payer: Self-pay

## 2020-12-17 ENCOUNTER — Ambulatory Visit (INDEPENDENT_AMBULATORY_CARE_PROVIDER_SITE_OTHER): Payer: Medicare Other | Admitting: Family Medicine

## 2020-12-17 ENCOUNTER — Other Ambulatory Visit: Payer: Self-pay

## 2020-12-17 ENCOUNTER — Encounter: Payer: Self-pay | Admitting: Family Medicine

## 2020-12-17 VITALS — BP 72/38 | HR 66 | Resp 15 | Wt 241.0 lb

## 2020-12-17 DIAGNOSIS — E039 Hypothyroidism, unspecified: Secondary | ICD-10-CM

## 2020-12-17 DIAGNOSIS — I10 Essential (primary) hypertension: Secondary | ICD-10-CM

## 2020-12-17 DIAGNOSIS — D508 Other iron deficiency anemias: Secondary | ICD-10-CM | POA: Diagnosis not present

## 2020-12-17 DIAGNOSIS — N184 Chronic kidney disease, stage 4 (severe): Secondary | ICD-10-CM

## 2020-12-17 DIAGNOSIS — R609 Edema, unspecified: Secondary | ICD-10-CM

## 2020-12-17 MED ORDER — METOPROLOL TARTRATE 25 MG PO TABS: 12.5000 mg | ORAL_TABLET | Freq: Two times a day (BID) | ORAL | 3 refills | Status: DC

## 2020-12-17 MED ORDER — ROSUVASTATIN CALCIUM 10 MG PO TABS: 10.0000 mg | ORAL_TABLET | Freq: Every day | ORAL | 3 refills | Status: DC

## 2020-12-17 MED ORDER — KETOCONAZOLE 2 % EX CREA: 1.0000 "application " | TOPICAL_CREAM | Freq: Every day | CUTANEOUS | 0 refills | Status: DC | PRN

## 2020-12-17 NOTE — Progress Notes (Signed)
Established patient visit   Patient: Sherry Long   DOB: 02-Oct-1940   80 y.o. Female  MRN: [id number] Visit Date: 12/17/2020  Today's healthcare provider: Gwyneth Sprout, FNP   Chief Complaint  Patient presents with   Hypertension   Hypothyroidism   Subjective    HPI  Hypertension, follow-up  BP Readings from Last 3 Encounters:  12/17/20 (!) 72/38  05/28/20 (!) 144/66  04/30/20 130/80   Wt Readings from Last 3 Encounters:  12/17/20 241 lb (109.3 kg)  05/28/20 264 lb (119.7 kg)  04/30/20 267 lb (121.1 kg)     She was last seen for hypertension 3 months ago.  BP at that visit was 183/96. Management since that visit includes continue Metoprolol and Losartan. Patient did d/c Losartan   She reports excellent compliance with treatment. She is not having side effects.  She is following a Regular diet. She is not exercising. She does not smoke.  Use of agents associated with hypertension: NSAIDS.   Outside blood pressures are not being checked. Symptoms: No chest pain No chest pressure  No palpitations No syncope  Yes dyspnea No orthopnea  No paroxysmal nocturnal dyspnea Yes lower extremity edema   Pertinent labs: Lab Results  Component Value Date   CHOL 150 09/18/2019   HDL 72 09/18/2019   LDLCALC 64 09/18/2019   TRIG 70 09/18/2019   CHOLHDL 2.1 09/09/2018   Lab Results  Component Value Date   NA 133 (L) 06/16/2020   K 3.6 06/16/2020   CREATININE 2.76 (H) 06/16/2020   GFRNONAA 17 (L) 06/16/2020   GLUCOSE 95 06/16/2020   TSH 1.310 09/18/2019     The ASCVD Risk score (Arnett DK, et al., 2019) failed to calculate for the following reasons:   The 2019 ASCVD risk score is only valid for ages 63 to 46   ---------------------------------------------------------------------------------------------------  Hypothyroid, follow-up  Lab Results  Component Value Date   TSH 1.310 09/18/2019   TSH 0.975 09/09/2018   TSH 0.945 07/04/2017   Wt Readings from  Last 3 Encounters:  12/17/20 241 lb (109.3 kg)  05/28/20 264 lb (119.7 kg)  04/30/20 267 lb (121.1 kg)    She was last seen for hypothyroid 3 months ago.  Management since that visit includes none. She reports excellent compliance with treatment. She is not having side effects.   Symptoms: Yes change in energy level No constipation  No diarrhea No heat / cold intolerance  No nervousness No palpitations  No weight changes    -----------------------------------------------------------------------------------------    Medications: Outpatient Medications Prior to Visit  Medication Sig   acetaminophen (TYLENOL) 500 MG tablet Take 500 mg by mouth as needed.   allopurinol (ZYLOPRIM) 100 MG tablet TAKE 2 TABLETS BY MOUTH EVERY DAY   Ascorbic Acid (VITAMIN C PO) Take by mouth daily.   aspirin 81 MG tablet Take 81 mg by mouth daily.    furosemide (LASIX) 40 MG tablet Take 1 tablet (40 mg total) by mouth daily.   levothyroxine (SYNTHROID) 137 MCG tablet TAKE 1 TABLET BY MOUTH DAILY   Multiple Vitamins-Iron (MULTIVITAMIN PLUS IRON ADULT PO) Take by mouth daily.   [DISCONTINUED] ketoconazole (NIZORAL) 2 % cream Apply 1 application topically daily as needed.    [DISCONTINUED] metoprolol tartrate (LOPRESSOR) 25 MG tablet Take 1 tablet (25 mg total) by mouth 2 (two) times daily.   No facility-administered medications prior to visit.    Review of Systems     Objective  BP (!) 72/38 (BP Location: Right Wrist, Patient Position: Sitting)   Pulse 66   Resp 15   Wt 241 lb (109.3 kg)   SpO2 100%   BMI 38.90 kg/m    Physical Exam Vitals and nursing note reviewed.  Constitutional:      General: She is not in acute distress.    Appearance: Normal appearance. She is obese. She is not ill-appearing, toxic-appearing or diaphoretic.  HENT:     Head: Normocephalic and atraumatic.  Neck:     Vascular: JVD present.  Cardiovascular:     Rate and Rhythm: Normal rate and regular rhythm.      Pulses: Normal pulses.     Heart sounds: Murmur heard.    No friction rub. No gallop.  Pulmonary:     Effort: Pulmonary effort is normal. No respiratory distress.     Breath sounds: No stridor. Examination of the right-lower field reveals decreased breath sounds. Examination of the left-lower field reveals decreased breath sounds. Decreased breath sounds present. No wheezing, rhonchi or rales.  Chest:     Chest wall: No tenderness.  Abdominal:     General: Bowel sounds are normal.     Palpations: Abdomen is soft.  Musculoskeletal:        General: Swelling present. No tenderness, deformity or signs of injury. Normal range of motion.     Right lower leg: 3+ Edema present.     Left lower leg: 2+ Edema present.     Comments: Use of walker  Skin:    General: Skin is warm and dry.     Capillary Refill: Capillary refill takes less than 2 seconds.     Coloration: Skin is not jaundiced or pale.     Findings: No bruising, erythema, lesion or rash.  Neurological:     General: No focal deficit present.     Mental Status: She is alert and oriented to person, place, and time. Mental status is at baseline.     Cranial Nerves: No cranial nerve deficit.     Sensory: No sensory deficit.     Motor: No weakness.     Coordination: Coordination normal.  Psychiatric:        Mood and Affect: Mood normal.        Behavior: Behavior normal.        Thought Content: Thought content normal.        Judgment: Judgment normal.      No results found for any visits on 12/17/20.  Assessment & Plan     Problem List Items Addressed This Visit       Cardiovascular and Mediastinum   Essential hypertension    Hold BB tonight 1/2 BB going fwd Recommend RTC for BP check- pt deferred Michela Pitcher would check at home and call office      Relevant Medications   rosuvastatin (CRESTOR) 10 MG tablet   metoprolol tartrate (LOPRESSOR) 25 MG tablet     Endocrine   Adult hypothyroidism    Repeat labs Change in  activity Purposeful weight loss      Relevant Medications   metoprolol tartrate (LOPRESSOR) 25 MG tablet   Other Relevant Orders   T4, free   TSH     Genitourinary   Chronic kidney disease, stage IV (severe) (HCC) - Primary    Chronic, stable Fluid intake appears normal Repeat chem      Relevant Medications   rosuvastatin (CRESTOR) 10 MG tablet   Other Relevant Orders   Lipid  panel     Other   Edema    R>L Continues to use walker Denies walks Lives alone      Relevant Orders   Comprehensive metabolic panel   Other Visit Diagnoses     Other iron deficiency anemia       Relevant Orders   Iron, TIBC and Ferritin Panel        Return in about 3 months (around 03/19/2021), or if symptoms worsen or fail to improve.      Vonna Kotyk, FNP, have reviewed all documentation for this visit. The documentation on 12/17/20 for the exam, diagnosis, procedures, and orders are all accurate and complete.    Gwyneth Sprout, [place] [phone] (phone) [phone] (fax)  [place]

## 2020-12-17 NOTE — Assessment & Plan Note (Signed)
Repeat labs Change in activity Purposeful weight loss

## 2020-12-17 NOTE — Telephone Encounter (Signed)
Called patient.  No answer. LMOV.  Made patient aware to ask for me when she calls back.

## 2020-12-17 NOTE — Assessment & Plan Note (Signed)
Hold BB tonight 1/2 BB going fwd Recommend RTC for BP check- pt deferred Michela Pitcher would check at home and call office

## 2020-12-17 NOTE — Assessment & Plan Note (Signed)
Chronic, stable Fluid intake appears normal Repeat chem

## 2020-12-17 NOTE — Telephone Encounter (Signed)
Received a secure chat from Tally Joe, [place]   " Sherry Long just came in for an appt; BP 70s/30s. Purposeful weight loss. Edema remains in R and L legs. R>L. JVD remains, I told her to hold metop tonight and plan to 1/2 going fwd. Left lasix alone. She continues to have dizziness. Denies falls. Plan to check back with her on Monday to see how her BP is. "  Per Dr. Garen Lah  " stop lopressor. thank you"  I have called patient to make ger aware of Dr. Garen Lah recommendations with no answer. LMOV for patient to call back.

## 2020-12-17 NOTE — Addendum Note (Signed)
Addended by: Kavin Leech on: 12/17/2020 03:51 PM   Modules accepted: Orders

## 2020-12-17 NOTE — Assessment & Plan Note (Signed)
R>L Continues to use walker Denies walks Lives alone

## 2020-12-17 NOTE — Telephone Encounter (Signed)
Spoke with patient and informed her of Dr. Thereasa Solo recommendation as documented below. Patient agreed to stop her Metoprolol Tartrate, and was grateful for the follow up.

## 2020-12-17 NOTE — Telephone Encounter (Signed)
Patient returning call.

## 2020-12-17 NOTE — Patient Instructions (Signed)
HOLD BP medication, Metoprolol, Friday night  Start 1/2 tablet Saturday morning and continue with 1/2 tablet in evening as well  Continue with 1/2 tablets until you call us on Monday with your blood pressure- that you take with your machine.  Continue to stay hydrated; water is your best friend

## 2020-12-18 LAB — COMPREHENSIVE METABOLIC PANEL
ALT: 13 IU/L (ref 0–32)
AST: 23 IU/L (ref 0–40)
Albumin/Globulin Ratio: 1.9 (ref 1.2–2.2)
Albumin: 3.9 g/dL (ref 3.7–4.7)
Alkaline Phosphatase: 148 IU/L — ABNORMAL HIGH (ref 44–121)
BUN/Creatinine Ratio: 17 (ref 12–28)
BUN: 49 mg/dL — ABNORMAL HIGH (ref 8–27)
Bilirubin Total: 0.3 mg/dL (ref 0.0–1.2)
CO2: 21 mmol/L (ref 20–29)
Calcium: 9.8 mg/dL (ref 8.7–10.3)
Chloride: 95 mmol/L — ABNORMAL LOW (ref 96–106)
Creatinine, Ser: 2.86 mg/dL — ABNORMAL HIGH (ref 0.57–1.00)
Globulin, Total: 2.1 g/dL (ref 1.5–4.5)
Glucose: 115 mg/dL — ABNORMAL HIGH (ref 70–99)
Potassium: 4.8 mmol/L (ref 3.5–5.2)
Sodium: 131 mmol/L — ABNORMAL LOW (ref 134–144)
Total Protein: 6 g/dL (ref 6.0–8.5)
eGFR: 16 mL/min/{1.73_m2} — ABNORMAL LOW (ref >59)

## 2020-12-18 LAB — IRON,TIBC AND FERRITIN PANEL
Ferritin: 278 ng/mL — ABNORMAL HIGH (ref 15–150)
Iron Saturation: 26 % (ref 15–55)
Iron: 63 ug/dL (ref 27–139)
Total Iron Binding Capacity: 241 ug/dL — ABNORMAL LOW (ref 250–450)
UIBC: 178 ug/dL (ref 118–369)

## 2020-12-18 LAB — LIPID PANEL
Chol/HDL Ratio: 2 ratio (ref 0.0–4.4)
Cholesterol, Total: 145 mg/dL (ref 100–199)
HDL: 73 mg/dL (ref >39)
LDL Chol Calc (NIH): 54 mg/dL (ref 0–99)
Triglycerides: 97 mg/dL (ref 0–149)
VLDL Cholesterol Cal: 18 mg/dL (ref 5–40)

## 2020-12-18 LAB — TSH: TSH: 1.39 u[IU]/mL (ref 0.450–4.500)

## 2020-12-18 LAB — T4, FREE: Free T4: 1.38 ng/dL (ref 0.82–1.77)

## 2020-12-20 NOTE — Progress Notes (Signed)
Please call to get recent Bps; if remain low we have the go ahead from cards to stop metop.  Slight elevation in kidney function.  Would recommend continue water as drink of choice and scale back on juice, coffee, tea etc..  Please let us know if you have any questions.  Thank you,  Tally Joe, FNP

## 2020-12-23 ENCOUNTER — Ambulatory Visit: Payer: Self-pay

## 2020-12-23 NOTE — Telephone Encounter (Signed)
Pt called in and states her BP is elevated since BP med was dc'd on Friday by cardiology. She also was told prior to cardiologist by Daneil Dan, NP to take 1/2 in morning and 1/2 pill evening, Pt unsure what to do since BP elevating back up. Scheduled pt for appt tomorrow morning at 1040 via telephone with Daneil Dan, NP to discuss which direction she needs to go for BP med. Got pt to take new BP while on phone and she states it was 165/94. Pt asked if she should take BP med or not. Advised she could take 1/2 pill now and then recheck BP and just monitor BP until morning for her appt. Pt verbalized understanding. Care advice given and advised pt to call back if BP increases or to go to ER if BP gets > 160/100.    Reason for Disposition  [8] Systolic BP  >= 099 OR Diastolic >= 80 AND [8] not taking BP medications  Answer Assessment - Initial Assessment Questions 1. BLOOD PRESSURE: "What is the blood pressure?" "Did you take at least two measurements 5 minutes apart?"     172/89 HR 84 2. ONSET: "When did you take your blood pressure?"     1530 3. HOW: "How did you obtain the blood pressure?" (e.g., visiting nurse, automatic home BP monitor)     Automatic cuff, wrist 4. HISTORY: "Do you have a history of high blood pressure?"     Yes 5. MEDICATIONS: "Are you taking any medications for blood pressure?" "Have you missed any doses recently?"     Friday PCP called and stated 1/2 in morning and 1/2 evening, then Cardiology called and said dc all together  6. OTHER SYMPTOMS: "Do you have any symptoms?" (e.g., headache, chest pain, blurred vision, difficulty breathing, weakness)     Early in morning have slight headache 7. PREGNANCY: "Is there any chance you are pregnant?" "When was your last menstrual period?"     No  Protocols used: Blood Pressure - High-A-AH

## 2020-12-24 ENCOUNTER — Ambulatory Visit (INDEPENDENT_AMBULATORY_CARE_PROVIDER_SITE_OTHER): Payer: Medicare Other | Admitting: Family Medicine

## 2020-12-24 ENCOUNTER — Encounter: Payer: Self-pay | Admitting: Family Medicine

## 2020-12-24 VITALS — BP 163/83

## 2020-12-24 DIAGNOSIS — R609 Edema, unspecified: Secondary | ICD-10-CM

## 2020-12-24 DIAGNOSIS — I10 Essential (primary) hypertension: Secondary | ICD-10-CM

## 2020-12-24 DIAGNOSIS — R42 Dizziness and giddiness: Secondary | ICD-10-CM | POA: Diagnosis not present

## 2020-12-24 MED ORDER — METOPROLOL TARTRATE 25 MG PO TABS: 12.5000 mg | ORAL_TABLET | Freq: Two times a day (BID) | ORAL | 3 refills | Status: DC

## 2020-12-24 NOTE — Assessment & Plan Note (Signed)
Pt called with conflicting information from PCP and cardiology; unsure how cardiology knew she was seen at PCP and that BP was low I advised her that I reached out to cards given low Bps and plan that we had agreed upon- since that appt she has been contacted by cards office to stop BB all together. SBP and DBP have continued to rise; pt has been keeping a log- called office 11/17 and got advise from RN to take 1/2 tab metop and watch BP- BP went to 140/90. This am, BP 170 and then 160/100-90. Advised, that she should be OK to take 1/2 BID as we had discussed.

## 2020-12-24 NOTE — Assessment & Plan Note (Signed)
Pt reported that she remains on Lasix; and did not stop this medication Reported edema remains in BLE Denies concerns

## 2020-12-24 NOTE — Assessment & Plan Note (Signed)
Reported that since stopping BB last week that dizziness has subsided; advised that this is not uncommon and that to be mindful and slow with position changes given the rate reducing effect of BB Advised of recent lab results and reinforced water as primary drink of choice given eGFR and creatinine. Pt voiced understanding

## 2020-12-24 NOTE — Progress Notes (Signed)
Virtual telephone visit    Virtual Visit via Telephone Note   This visit type was conducted due to national recommendations for restrictions regarding the COVID-19 Pandemic (e.g. social distancing) in an effort to limit this patient's exposure and mitigate transmission in our community. Due to her co-morbid illnesses, this patient is at least at moderate risk for complications without adequate follow up. This format is felt to be most appropriate for this patient at this time. The patient did not have access to video technology or had technical difficulties with video requiring transitioning to audio format only (telephone). Physical exam was limited to content and character of the telephone converstion.    Patient location: home Provider location: [facility]  I discussed the limitations of evaluation and management by telemedicine and the availability of in person appointments. The patient expressed understanding and agreed to proceed.   Visit Date: 12/24/2020  Today's healthcare provider: Gwyneth Sprout, FNP   Chief Complaint  Patient presents with   Hypertension   Subjective    HPI  Hypertension, follow-up  BP Readings from Last 3 Encounters:  12/24/20 (!) 163/83  12/17/20 (!) 72/38  05/28/20 (!) 144/66   Wt Readings from Last 3 Encounters:  12/17/20 241 lb (109.3 kg)  05/28/20 264 lb (119.7 kg)  04/30/20 267 lb (121.1 kg)     She was last seen for hypertension 3 months ago.  BP at that visit was 72/38. Management since that visit includes Will keep metoprolol the same 45m BID and add losartan . Patient states that cardiologist recommended to stop all blood pressure medication,   She reports fair compliance with treatment. She is not having side effects.  She is following a Regular diet. She is not exercising. She does not smoke.  Use of agents associated with hypertension: none.   Outside blood pressures are systolic ranging 1161-096diastolic  804-54 Symptoms: No chest pain No chest pressure  No palpitations No syncope  No dyspnea No orthopnea  No paroxysmal nocturnal dyspnea Yes lower extremity edema   Pertinent labs: Lab Results  Component Value Date   CHOL 145 12/17/2020   HDL 73 12/17/2020   LDLCALC 54 12/17/2020   TRIG 97 12/17/2020   CHOLHDL 2.0 12/17/2020   Lab Results  Component Value Date   NA 131 (L) 12/17/2020   K 4.8 12/17/2020   CREATININE 2.86 (H) 12/17/2020   EGFR 16 (L) 12/17/2020   GLUCOSE 115 (H) 12/17/2020   TSH 1.390 12/17/2020     The ASCVD Risk score (Arnett DK, et al., 2019) failed to calculate for the following reasons:   The 2019 ASCVD risk score is only valid for ages 438to 762  ---------------------------------------------------------------------------------------------------     Medications: Outpatient Medications Prior to Visit  Medication Sig   acetaminophen (TYLENOL) 500 MG tablet Take 500 mg by mouth as needed.   allopurinol (ZYLOPRIM) 100 MG tablet TAKE 2 TABLETS BY MOUTH EVERY DAY   Ascorbic Acid (VITAMIN C PO) Take by mouth daily.   aspirin 81 MG tablet Take 81 mg by mouth daily.    furosemide (LASIX) 40 MG tablet Take 1 tablet (40 mg total) by mouth daily.   ketoconazole (NIZORAL) 2 % cream Apply 1 application topically daily as needed.   levothyroxine (SYNTHROID) 137 MCG tablet TAKE 1 TABLET BY MOUTH DAILY   Multiple Vitamins-Iron (MULTIVITAMIN PLUS IRON ADULT PO) Take by mouth daily.   rosuvastatin (CRESTOR) 10 MG tablet Take 1 tablet (10 mg  total) by mouth daily.   No facility-administered medications prior to visit.    Review of Systems    Objective    BP (!) 163/83      Assessment & Plan     Problem List Items Addressed This Visit       Cardiovascular and Mediastinum   Essential hypertension - Primary    Pt called with conflicting information from PCP and cardiology; unsure how cardiology knew she was seen at PCP and that BP was low I advised her that  I reached out to cards given low Bps and plan that we had agreed upon- since that appt she has been contacted by cards office to stop BB all together. SBP and DBP have continued to rise; pt has been keeping a log- called office 11/17 and got advise from RN to take 1/2 tab metop and watch BP- BP went to 140/90. This am, BP 170 and then 160/100-90. Advised, that she should be OK to take 1/2 BID as we had discussed.      Relevant Medications   metoprolol tartrate (LOPRESSOR) 25 MG tablet     Other   Edema    Pt reported that she remains on Lasix; and did not stop this medication Reported edema remains in BLE Denies concerns      Dizziness    Reported that since stopping BB last week that dizziness has subsided; advised that this is not uncommon and that to be mindful and slow with position changes given the rate reducing effect of BB Advised of recent lab results and reinforced water as primary drink of choice given eGFR and creatinine. Pt voiced understanding        Return in about 5 days (around 12/29/2020) for HTN management.    I discussed the assessment and treatment plan with the patient. The patient was provided an opportunity to ask questions and all were answered. The patient agreed with the plan and demonstrated an understanding of the instructions.   The patient was advised to call back or seek an in-person evaluation if the symptoms worsen or if the condition fails to improve as anticipated.  I provided 9 minutes of non-face-to-face time during this encounter.  Vonna Kotyk, FNP, have reviewed all documentation for this visit. The documentation on 12/24/20 for the exam, diagnosis, procedures, and orders are all accurate and complete.   Gwyneth Sprout, [place] [phone] (phone) [phone] (fax)  [place]

## 2021-01-03 ENCOUNTER — Other Ambulatory Visit: Payer: Self-pay | Admitting: Family Medicine

## 2021-01-03 DIAGNOSIS — E039 Hypothyroidism, unspecified: Secondary | ICD-10-CM

## 2021-01-05 NOTE — Telephone Encounter (Signed)
Requested Prescriptions  Pending Prescriptions Disp Refills  . levothyroxine (SYNTHROID) 137 MCG tablet [Pharmacy Med Name: LEVOTHYROXINE SODIUM 137 MCG TAB] 30 tablet 2    Sig: TAKE 1 TABLET BY MOUTH DAILY     Endocrinology:  Hypothyroid Agents Failed - 01/03/2021  3:27 PM      Failed - TSH needs to be rechecked within 3 months after an abnormal result. Refill until TSH is due.      Passed - TSH in normal range and within 360 days    TSH  Date Value Ref Range Status  12/17/2020 1.390 0.450 - 4.500 uIU/mL Final         Passed - Valid encounter within last 12 months    Recent Outpatient Visits          1 week ago Essential hypertension   [facility] Tally Joe T, FNP   2 weeks ago Chronic kidney disease, stage IV (severe) [facility] [place])   [facility] Gwyneth Sprout, FNP   1 year ago Annual physical exam   [facility] Marlyn Corporal, Clearnce Sorrel, [place]   2 years ago Annual physical exam   [facility] [place], Clearnce Sorrel, [place]   3 years ago Essential hypertension   [place], [place], [place]

## 2021-01-13 ENCOUNTER — Other Ambulatory Visit: Payer: Self-pay | Admitting: Family Medicine

## 2021-02-01 ENCOUNTER — Other Ambulatory Visit: Payer: Self-pay | Admitting: Family Medicine

## 2021-02-01 DIAGNOSIS — E039 Hypothyroidism, unspecified: Secondary | ICD-10-CM

## 2021-03-04 DIAGNOSIS — N184 Chronic kidney disease, stage 4 (severe): Secondary | ICD-10-CM | POA: Diagnosis not present

## 2021-03-08 DIAGNOSIS — D631 Anemia in chronic kidney disease: Secondary | ICD-10-CM | POA: Diagnosis not present

## 2021-03-08 DIAGNOSIS — N184 Chronic kidney disease, stage 4 (severe): Secondary | ICD-10-CM | POA: Diagnosis not present

## 2021-03-08 DIAGNOSIS — I10 Essential (primary) hypertension: Secondary | ICD-10-CM | POA: Diagnosis not present

## 2021-03-09 ENCOUNTER — Telehealth: Payer: Self-pay | Admitting: Family Medicine

## 2021-03-09 NOTE — Telephone Encounter (Signed)
Copied from [place] [phone]. Topic: Medicare AWV >> Mar 09, 2021  2:04 PM Cher Nakai R wrote: Reason for CRM:  Left message for patient to call back and schedule Medicare Annual Wellness Visit (AWV) in office.   If not able to come in office, please offer to do virtually or by telephone.   Last AWV:07/09/2019  Please schedule at anytime with [facility] Health Advisor.  If any questions, please contact me at [phone]

## 2021-03-29 ENCOUNTER — Ambulatory Visit (INDEPENDENT_AMBULATORY_CARE_PROVIDER_SITE_OTHER): Payer: Medicare Other

## 2021-03-29 DIAGNOSIS — Z0000 Encounter for general adult medical examination without abnormal findings: Secondary | ICD-10-CM | POA: Diagnosis not present

## 2021-03-29 NOTE — Progress Notes (Signed)
Virtual Visit via Telephone Note  I connected with  Sherry Long on 03/29/21 at  3:40 PM EST by telephone and verified that I am speaking with the correct person using two identifiers.  Location: Patient: home Provider: [facility] Persons participating in the virtual visit: [place]   I discussed the limitations, risks, security and privacy concerns of performing an evaluation and management service by telephone and the availability of in person appointments. The patient expressed understanding and agreed to proceed.  Interactive audio and video telecommunications were attempted between this nurse and patient, however failed, due to patient having technical difficulties OR patient did not have access to video capability.  We continued and completed visit with audio only.  Some vital signs may be absent or patient reported.   Dionisio David, LPN  Subjective:   Sherry Long is a 81 y.o. female who presents for Medicare Annual (Subsequent) preventive examination.  Review of Systems           Objective:    There were no vitals filed for this visit. There is no height or weight on file to calculate BMI.  Advanced Directives 12/30/2019 12/09/2019 07/09/2019 07/08/2018 07/04/2017 07/06/2016 06/29/2016  Does Patient Have a Medical Advance Directive? Yes Yes Yes Yes No No No  Type of Paramedic of [place];Living will [place];Living will [place];Living will [place];Living will - - -  Does patient want to make changes to medical advance directive? No - Patient declined No - Patient declined - - - - -  Copy of [place] in Chart? Yes - validated most recent copy scanned in chart (See row information) No - copy requested Yes - validated most recent copy scanned in chart (See row information) Yes - validated most recent copy scanned in chart (See row information) - - -  Would  patient like information on creating a medical advance directive? - - - - Yes (MAU/Ambulatory/Procedural Areas - Information given) - No - Patient declined    Current Medications (verified) Outpatient Encounter Medications as of 03/29/2021  Medication Sig   acetaminophen (TYLENOL) 500 MG tablet Take 500 mg by mouth as needed.   allopurinol (ZYLOPRIM) 100 MG tablet TAKE 2 TABLETS BY MOUTH EVERY DAY   Ascorbic Acid (VITAMIN C PO) Take by mouth daily.   aspirin 81 MG tablet Take 81 mg by mouth daily.    furosemide (LASIX) 40 MG tablet Take 1 tablet (40 mg total) by mouth daily.   ketoconazole (NIZORAL) 2 % cream Apply 1 application topically daily as needed.   levothyroxine (SYNTHROID) 137 MCG tablet TAKE 1 TABLET BY MOUTH DAILY   metoprolol tartrate (LOPRESSOR) 25 MG tablet TAKE 1 TABLET BY MOUTH 2 TIMES DAILY   Multiple Vitamins-Iron (MULTIVITAMIN PLUS IRON ADULT PO) Take by mouth daily.   rosuvastatin (CRESTOR) 10 MG tablet Take 1 tablet (10 mg total) by mouth daily.   No facility-administered encounter medications on file as of 03/29/2021.    Allergies (verified) Aspirin and Hydrochlorothiazide   History: Past Medical History:  Diagnosis Date   Arthritis    lumbar spine   Chronic kidney disease (CKD), stage IV (severe) (HCC)    Gout    Hypertension    Hypothyroidism    Morbid obesity ([place])    Sciatica    right   Wears dentures    full upper   Past Surgical History:  Procedure Laterality Date  CATARACT EXTRACTION W/PHACO Right 12/09/2019   Procedure: CATARACT EXTRACTION PHACO AND INTRAOCULAR LENS PLACEMENT (IOC) RIGHT 4.79 00:37.7;  Surgeon: Birder Robson, MD;  Location: [facility];  Service: Ophthalmology;  Laterality: Right;   CATARACT EXTRACTION W/PHACO Left 12/30/2019   Procedure: CATARACT EXTRACTION PHACO AND INTRAOCULAR LENS PLACEMENT (IOC) LEFT 5.75 00:35.6 ;  Surgeon: Birder Robson, MD;  Location: [facility];  Service: Ophthalmology;   Laterality: Left;   COLONOSCOPY     TONSILLECTOMY AND ADENOIDECTOMY  1948   Family History  Problem Relation Age of Onset   Heart disease Mother    Alzheimer's disease Mother    Stroke Father    Hypertension Sister    Heart disease Sister    Heart disease Maternal Grandmother    Stroke Maternal Grandmother    Colon cancer Other    Social History   Socioeconomic History   Marital status: Widowed    Spouse name: Not on file   Number of children: 1   Years of education: Not on file   Highest education level: Associate degree: occupational, Hotel manager, or vocational program  Occupational History   Occupation: retired  Tobacco Use   Smoking status: Former    Types: Cigarettes    Quit date: 02/06/1976    Years since quitting: 45.1   Smokeless tobacco: Never   Tobacco comments:    quit in 20's  Vaping Use   Vaping Use: Never used  Substance and Sexual Activity   Alcohol use: No   Drug use: No   Sexual activity: Not on file  Other Topics Concern   Not on file  Social History Narrative   1 son - deceased   Social Determinants of Health   Financial Resource Strain: Not on file  Food Insecurity: Not on file  Transportation Needs: Not on file  Physical Activity: Not on file  Stress: Not on file  Social Connections: Not on file    Tobacco Counseling Counseling given: Not Answered Tobacco comments: quit in 20's   Clinical Intake:  Pre-visit preparation completed: Yes  Pain : No/denies pain     Nutritional Risks: None Diabetes: No  How often do you need to have someone help you when you read instructions, pamphlets, or other written materials from your doctor or pharmacy?: 1 - Never  Diabetic?no  Interpreter Needed?: No  Information entered by :: Kirke Shaggy, LPN   Activities of Daily Living No flowsheet data found.  Patient Care Team: Gwyneth Sprout, FNP as PCP - General (Family Medicine) Horald Chestnut, DO as Consulting Physician  (Nephrology)  Indicate any recent Medical Services you may have received from other than [facility] providers in the past year (date may be approximate).     Assessment:   This is a routine wellness examination for Sherry Long.  Hearing/Vision screen No results found.  Dietary issues and exercise activities discussed:     Goals Addressed   None    Depression Screen PHQ 2/9 Scores 07/09/2019 07/08/2018 07/04/2017 07/04/2017 06/29/2016 06/29/2016 06/23/2015  PHQ - 2 Score 0 0 1 1 0 0 0  PHQ- 9 Score - - 5 - 3 - -    Fall Risk Fall Risk  07/09/2019 07/08/2018 07/04/2017 06/29/2016 06/23/2015  Falls in the past year? 0 0 No No No  Number falls in past yr: 0 - - - -  Injury with Fall? 0 - - - -    FALL RISK PREVENTION PERTAINING TO THE HOME:  Any stairs in or around  the home? Yes  If so, are there any without handrails? No  Home free of loose throw rugs in walkways, pet beds, electrical cords, etc? Yes  Adequate lighting in your home to reduce risk of falls? Yes   ASSISTIVE DEVICES UTILIZED TO PREVENT FALLS:  Life alert? Yes  Use of a cane, walker or w/c? Yes  Grab bars in the bathroom? Yes  Shower chair or bench in shower? No  Elevated toilet seat or a handicapped toilet? Yes   Cognitive Function:    6CIT Screen 07/09/2019 07/08/2018 06/29/2016  What Year? 0 points 0 points 0 points  What month? 0 points 0 points 0 points  What time? 0 points 0 points 0 points  Count back from 20 0 points 0 points 0 points  Months in reverse 0 points 0 points 0 points  Repeat phrase 0 points 0 points 0 points  Total Score 0 0 0    Immunizations Immunization History  Administered Date(s) Administered   Pneumococcal Conjugate-13 06/22/2014   Pneumococcal Polysaccharide-23 06/23/2015   Td 08/09/1998    TDAP status: Due, Education has been provided regarding the importance of this vaccine. Advised may receive this vaccine at local pharmacy or Health Dept. Aware to provide a copy of the vaccination record if  obtained from local pharmacy or Health Dept. Verbalized acceptance and understanding.  Flu Vaccine status: Declined, Education has been provided regarding the importance of this vaccine but patient still declined. Advised may receive this vaccine at local pharmacy or Health Dept. Aware to provide a copy of the vaccination record if obtained from local pharmacy or Health Dept. Verbalized acceptance and understanding.  Pneumococcal vaccine status: Up to date  Covid-19 vaccine status: Completed vaccines- pt doesn't know the dates  Qualifies for Shingles Vaccine? No   Zostavax completed No   Shingrix Completed?: No.    Education has been provided regarding the importance of this vaccine. Patient has been advised to call insurance company to determine out of pocket expense if they have not yet received this vaccine. Advised may also receive vaccine at local pharmacy or Health Dept. Verbalized acceptance and understanding.  Screening Tests Health Maintenance  Topic Date Due   COVID-19 Vaccine (1) Never done   Zoster Vaccines- Shingrix (1 of 2) Never done   INFLUENZA VACCINE  Never done   TETANUS/TDAP  10/23/2020   Pneumonia Vaccine 5+ Years old  Completed   HPV VACCINES  Aged Out   DEXA SCAN  Discontinued    Health Maintenance  Health Maintenance Due  Topic Date Due   COVID-19 Vaccine (1) Never done   Zoster Vaccines- Shingrix (1 of 2) Never done   INFLUENZA VACCINE  Never done   TETANUS/TDAP  10/23/2020    Colorectal cancer screening: No longer required.   Mammogram status: No longer required due to age.  Bone Density status: Completed 05/23/11. Results reflect: Bone density results: NORMAL. Repeat every 5 years.- pt declined referral  Lung Cancer Screening: (Low Dose CT Chest recommended if Age 46-80 years, 30 pack-year currently smoking OR have quit w/in 15years.) does not qualify.   Additional Screening:  Hepatitis C Screening: does not qualify; Completed no  Vision  Screening: Recommended annual ophthalmology exams for early detection of glaucoma and other disorders of the eye. Is the patient up to date with their annual eye exam?  Yes  Who is the provider or what is the name of the office in which the patient attends annual eye exams? Dr.Bell If  pt is not established with a provider, would they like to be referred to a provider to establish care? No .   Dental Screening: Recommended annual dental exams for proper oral hygiene  Community Resource Referral / Chronic Care Management: CRR required this visit?  No   CCM required this visit?  No      Plan:     I have personally reviewed and noted the following in the patients chart:   Medical and social history Use of alcohol, tobacco or illicit drugs  Current medications and supplements including opioid prescriptions.  Functional ability and status Nutritional status Physical activity Advanced directives List of other physicians Hospitalizations, surgeries, and ER visits in previous 12 months Vitals Screenings to include cognitive, depression, and falls Referrals and appointments  In addition, I have reviewed and discussed with patient certain preventive protocols, quality metrics, and best practice recommendations. A written personalized care plan for preventive services as well as general preventive health recommendations were provided to patient.     Dionisio David, LPN   [id number]   Nurse Notes: none

## 2021-03-29 NOTE — Patient Instructions (Signed)
[name] , Thank you for taking time to come for your Medicare Wellness Visit. I appreciate your ongoing commitment to your health goals. Please review the following plan we discussed and let me know if I can assist you in the future.   Screening recommendations/referrals: Colonoscopy: aged out Mammogram: aged out Bone Density: declined referral Recommended yearly ophthalmology/optometry visit for glaucoma screening and checkup Recommended yearly dental visit for hygiene and checkup  Vaccinations: Influenza vaccine: n/d Pneumococcal vaccine: 06/23/15  Tdap vaccine: 08/09/1998, due Shingles vaccine: n/d   Covid-19:states had COVID shots (2)  Advanced directives: no  Conditions/risks identified: none  Next appointment: Follow up in one year for your annual wellness visit    Preventive Care 34 Years and Older, Female Preventive care refers to lifestyle choices and visits with your health care provider that can promote health and wellness. What does preventive care include? A yearly physical exam. This is also called an annual well check. Dental exams once or twice a year. Routine eye exams. Ask your health care provider how often you should have your eyes checked. Personal lifestyle choices, including: Daily care of your teeth and gums. Regular physical activity. Eating a healthy diet. Avoiding tobacco and drug use. Limiting alcohol use. Practicing safe sex. Taking low-dose aspirin every day. Taking vitamin and mineral supplements as recommended by your health care provider. What happens during an annual well check? The services and screenings done by your health care provider during your annual well check will depend on your age, overall health, lifestyle risk factors, and family history of disease. Counseling  Your health care provider may ask you questions about your: Alcohol use. Tobacco use. Drug use. Emotional well-being. Home and relationship well-being. Sexual  activity. Eating habits. History of falls. Memory and ability to understand (cognition). Work and work Statistician. Reproductive health. Screening  You may have the following tests or measurements: Height, weight, and BMI. Blood pressure. Lipid and cholesterol levels. These may be checked every 5 years, or more frequently if you are over 25 years old. Skin check. Lung cancer screening. You may have this screening every year starting at age 54 if you have a 30-pack-year history of smoking and currently smoke or have quit within the past 15 years. Fecal occult blood test (FOBT) of the stool. You may have this test every year starting at age 60. Flexible sigmoidoscopy or colonoscopy. You may have a sigmoidoscopy every 5 years or a colonoscopy every 10 years starting at age 76. Hepatitis C blood test. Hepatitis B blood test. Sexually transmitted disease (STD) testing. Diabetes screening. This is done by checking your blood sugar (glucose) after you have not eaten for a while (fasting). You may have this done every 1-3 years. Bone density scan. This is done to screen for osteoporosis. You may have this done starting at age 14. Mammogram. This may be done every 1-2 years. Talk to your health care provider about how often you should have regular mammograms. Talk with your health care provider about your test results, treatment options, and if necessary, the need for more tests. Vaccines  Your health care provider may recommend certain vaccines, such as: Influenza vaccine. This is recommended every year. Tetanus, diphtheria, and acellular pertussis (Tdap, Td) vaccine. You may need a Td booster every 10 years. Zoster vaccine. You may need this after age 110. Pneumococcal 13-valent conjugate (PCV13) vaccine. One dose is recommended after age 67. Pneumococcal polysaccharide (PPSV23) vaccine. One dose is recommended after age 30. Talk to your  health care provider about which screenings and vaccines  you need and how often you need them. This information is not intended to replace advice given to you by your health care provider. Make sure you discuss any questions you have with your health care provider. Document Released: 02/19/2015 Document Revised: 10/13/2015 Document Reviewed: 11/24/2014 Elsevier Interactive Patient Education  2017 [place] Prevention in the Home Falls can cause injuries. They can happen to people of all ages. There are many things you can do to make your home safe and to help prevent falls. What can I do on the outside of my home? Regularly fix the edges of walkways and driveways and fix any cracks. Remove anything that might make you trip as you walk through a door, such as a raised step or threshold. Trim any bushes or trees on the path to your home. Use bright outdoor lighting. Clear any walking paths of anything that might make someone trip, such as rocks or tools. Regularly check to see if handrails are loose or broken. Make sure that both sides of any steps have handrails. Any raised decks and porches should have guardrails on the edges. Have any leaves, snow, or ice cleared regularly. Use sand or salt on walking paths during winter. Clean up any spills in your garage right away. This includes oil or grease spills. What can I do in the bathroom? Use night lights. Install grab bars by the toilet and in the tub and shower. Do not use towel bars as grab bars. Use non-skid mats or decals in the tub or shower. If you need to sit down in the shower, use a plastic, non-slip stool. Keep the floor dry. Clean up any water that spills on the floor as soon as it happens. Remove soap buildup in the tub or shower regularly. Attach bath mats securely with double-sided non-slip rug tape. Do not have throw rugs and other things on the floor that can make you trip. What can I do in the bedroom? Use night lights. Make sure that you have a light by your bed that  is easy to reach. Do not use any sheets or blankets that are too big for your bed. They should not hang down onto the floor. Have a firm chair that has side arms. You can use this for support while you get dressed. Do not have throw rugs and other things on the floor that can make you trip. What can I do in the kitchen? Clean up any spills right away. Avoid walking on wet floors. Keep items that you use a lot in easy-to-reach places. If you need to reach something above you, use a strong step stool that has a grab bar. Keep electrical cords out of the way. Do not use floor polish or wax that makes floors slippery. If you must use wax, use non-skid floor wax. Do not have throw rugs and other things on the floor that can make you trip. What can I do with my stairs? Do not leave any items on the stairs. Make sure that there are handrails on both sides of the stairs and use them. Fix handrails that are broken or loose. Make sure that handrails are as long as the stairways. Check any carpeting to make sure that it is firmly attached to the stairs. Fix any carpet that is loose or worn. Avoid having throw rugs at the top or bottom of the stairs. If you do have throw rugs, attach them  to the floor with carpet tape. Make sure that you have a light switch at the top of the stairs and the bottom of the stairs. If you do not have them, ask someone to add them for you. What else can I do to help prevent falls? Wear shoes that: Do not have high heels. Have rubber bottoms. Are comfortable and fit you well. Are closed at the toe. Do not wear sandals. If you use a stepladder: Make sure that it is fully opened. Do not climb a closed stepladder. Make sure that both sides of the stepladder are locked into place. Ask someone to hold it for you, if possible. Clearly mark and make sure that you can see: Any grab bars or handrails. First and last steps. Where the edge of each step is. Use tools that help you  move around (mobility aids) if they are needed. These include: Canes. Walkers. Scooters. Crutches. Turn on the lights when you go into a dark area. Replace any light bulbs as soon as they burn out. Set up your furniture so you have a clear path. Avoid moving your furniture around. If any of your floors are uneven, fix them. If there are any pets around you, be aware of where they are. Review your medicines with your doctor. Some medicines can make you feel dizzy. This can increase your chance of falling. Ask your doctor what other things that you can do to help prevent falls. This information is not intended to replace advice given to you by your health care provider. Make sure you discuss any questions you have with your health care provider. Document Released: 11/19/2008 Document Revised: 07/01/2015 Document Reviewed: 02/27/2014 Elsevier Interactive Patient Education  2017 [employer].

## 2021-06-27 ENCOUNTER — Other Ambulatory Visit: Payer: Self-pay | Admitting: *Deleted

## 2021-06-27 ENCOUNTER — Telehealth: Payer: Self-pay | Admitting: Family Medicine

## 2021-06-27 DIAGNOSIS — I5189 Other ill-defined heart diseases: Secondary | ICD-10-CM

## 2021-06-27 NOTE — Telephone Encounter (Signed)
Attempted to schedule Busy signal

## 2021-06-28 NOTE — Telephone Encounter (Signed)
Requested medication (s) are due for refill today: yes  Requested medication (s) are on the active medication list: yes  Last refill:  05/28/20 #90/3  Future visit scheduled: no  Notes to clinic:  Unable to refill per protocol due to failed labs, no updated results.     Requested Prescriptions  Pending Prescriptions Disp Refills   furosemide (LASIX) 40 MG tablet [Pharmacy Med Name: FUROSEMIDE 40 MG TAB] 90 tablet 3    Sig: TAKE 1 TABLET BY MOUTH DAILY     Cardiovascular:  Diuretics - Loop Failed - 06/27/2021  4:44 PM      Failed - K in normal range and within 180 days    Potassium  Date Value Ref Range Status  12/17/2020 4.8 3.5 - 5.2 mmol/L Final         Failed - Ca in normal range and within 180 days    Calcium  Date Value Ref Range Status  12/17/2020 9.8 8.7 - 10.3 mg/dL Final         Failed - Na in normal range and within 180 days    Sodium  Date Value Ref Range Status  12/17/2020 131 (L) 134 - 144 mmol/L Final         Failed - Cr in normal range and within 180 days    Creatinine, Ser  Date Value Ref Range Status  12/17/2020 2.86 (H) 0.57 - 1.00 mg/dL Final         Failed - Cl in normal range and within 180 days    Chloride  Date Value Ref Range Status  12/17/2020 95 (L) 96 - 106 mmol/L Final         Failed - Mg Level in normal range and within 180 days    No results found for: MG       Failed - Last BP in normal range    BP Readings from Last 1 Encounters:  12/24/20 (!) 163/83         Passed - Valid encounter within last 6 months    Recent Outpatient Visits           6 months ago Essential hypertension   [facility] Tally Joe T, FNP   6 months ago Chronic kidney disease, stage IV (severe) [facility])   [facility] Gwyneth Sprout, FNP   1 year ago Annual physical exam   [facility] [place], Clearnce Sorrel, [place]   2 years ago Annual physical exam   [facility] [place], [place]arnce Sorrel, [place]   3  years ago Essential hypertension   [place], [place], [place]

## 2021-06-29 ENCOUNTER — Other Ambulatory Visit: Payer: Self-pay | Admitting: Family Medicine

## 2021-06-29 DIAGNOSIS — I5189 Other ill-defined heart diseases: Secondary | ICD-10-CM

## 2021-06-29 NOTE — Telephone Encounter (Signed)
Pt called in to follow up on refill request. Pt says that she is out of medication. I gave pt current status of Rx.   Please assist further.

## 2021-06-29 NOTE — Telephone Encounter (Signed)
Prescription refill sent into pharmacy earlier today. Patient advised.

## 2021-07-28 NOTE — Progress Notes (Unsigned)
Established patient visit   Patient: Sherry Long   DOB: [birth year]   81 y.o. Female  MRN: [id number] Visit Date: 08/02/2021  Today's healthcare provider: Gwyneth Sprout, FNP   No chief complaint on file.  Subjective    HPI  Hypertension, follow-up  BP Readings from Last 3 Encounters:  12/24/20 (!) 163/83  12/17/20 (!) 72/38  05/28/20 (!) 144/66   Wt Readings from Last 3 Encounters:  12/17/20 241 lb (109.3 kg)  05/28/20 264 lb (119.7 kg)  04/30/20 267 lb (121.1 kg)     She was last seen for hypertension 8 months ago.  BP at that visit was 163/83. Management since that visit includes continue medications.  She reports {excellent/good/fair/poor:19665} compliance with treatment. She {is/is not:9024} having side effects. {document side effects if present:1} She is following a {diet:21022986} diet. She {is/is not:9024} exercising. She {does/does not:200015} smoke.  Use of agents associated with hypertension: {bp agents assoc with hypertension:511::"none"}.   Outside blood pressures are {***enter patient reported home BP readings, or 'not being checked':1}. Symptoms: {Yes/No:20286} chest pain {Yes/No:20286} chest pressure  {Yes/No:20286} palpitations {Yes/No:20286} syncope  {Yes/No:20286} dyspnea {Yes/No:20286} orthopnea  {Yes/No:20286} paroxysmal nocturnal dyspnea {Yes/No:20286} lower extremity edema   Pertinent labs Lab Results  Component Value Date   CHOL 145 12/17/2020   HDL 73 12/17/2020   LDLCALC 54 12/17/2020   TRIG 97 12/17/2020   CHOLHDL 2.0 12/17/2020   Lab Results  Component Value Date   NA 131 (L) 12/17/2020   K 4.8 12/17/2020   CREATININE 2.86 (H) 12/17/2020   EGFR 16 (L) 12/17/2020   GLUCOSE 115 (H) 12/17/2020   TSH 1.390 12/17/2020     The ASCVD Risk score (Arnett DK, et al., 2019) failed to calculate for the following reasons:   The 2019 ASCVD risk score is only valid for ages 69 to  67  ---------------------------------------------------------------------------------------------------  Hypothyroid, follow-up  Lab Results  Component Value Date   TSH 1.390 12/17/2020   TSH 1.310 09/18/2019   TSH 0.975 09/09/2018   FREET4 1.38 12/17/2020    Wt Readings from Last 3 Encounters:  12/17/20 241 lb (109.3 kg)  05/28/20 264 lb (119.7 kg)  04/30/20 267 lb (121.1 kg)    She was last seen for hypothyroid 8 months ago.  Management since that visit includes continue medications. She reports {excellent/good/fair/poor:19665} compliance with treatment. She {is/is not:21021397} having side effects. {document side effects if present:1}  Symptoms: {Yes/No:20286} change in energy level {Yes/No:20286} constipation  {Yes/No:20286} diarrhea {Yes/No:20286} heat / cold intolerance  {Yes/No:20286} nervousness {Yes/No:20286} palpitations  {Yes/No:20286} weight changes    -----------------------------------------------------------------------------------------  Follow up for kidney disease  The patient was last seen for this 8 months ago. Changes made at last visit include continue medications.  She reports {excellent/good/fair/poor:19665} compliance with treatment. She feels that condition is {improved/worse/unchanged:3041574}. She {is/is not:21021397} having side effects. ***  -----------------------------------------------------------------------------------------   Medications: Outpatient Medications Prior to Visit  Medication Sig   acetaminophen (TYLENOL) 500 MG tablet Take 500 mg by mouth as needed.   allopurinol (ZYLOPRIM) 100 MG tablet TAKE 2 TABLETS BY MOUTH EVERY DAY   Ascorbic Acid (VITAMIN C PO) Take by mouth daily. (Patient not taking: Reported on 03/29/2021)   aspirin 81 MG tablet Take 81 mg by mouth daily.    furosemide (LASIX) 40 MG tablet Take 1 tablet (40 mg total) by mouth daily. Please schedule office visit before any future refill.   ketoconazole  (NIZORAL) 2 % cream Apply 1 application  topically daily as needed.   levothyroxine (SYNTHROID) 137 MCG tablet TAKE 1 TABLET BY MOUTH DAILY   metoprolol tartrate (LOPRESSOR) 25 MG tablet TAKE 1 TABLET BY MOUTH 2 TIMES DAILY   Multiple Vitamins-Iron (MULTIVITAMIN PLUS IRON ADULT PO) Take by mouth daily.   rosuvastatin (CRESTOR) 10 MG tablet Take 1 tablet (10 mg total) by mouth daily.   No facility-administered medications prior to visit.    Review of Systems  {Labs  Heme  Chem  Endocrine  Serology  Results Review (optional):23779}   Objective    There were no vitals taken for this visit. {Show previous vital signs (optional):23777}  Physical Exam  ***  No results found for any visits on 08/02/21.  Assessment & Plan     ***  No follow-ups on file.      {provider attestation***:1}   Gwyneth Sprout, [place] [phone] (phone) [phone] (fax)  [place]

## 2021-08-02 ENCOUNTER — Ambulatory Visit (INDEPENDENT_AMBULATORY_CARE_PROVIDER_SITE_OTHER): Payer: Medicare Other | Admitting: Family Medicine

## 2021-08-02 ENCOUNTER — Encounter: Payer: Self-pay | Admitting: Family Medicine

## 2021-08-02 VITALS — BP 134/82 | HR 83 | Temp 97.9°F | Resp 16 | Ht 65.0 in | Wt 240.6 lb

## 2021-08-02 DIAGNOSIS — D508 Other iron deficiency anemias: Secondary | ICD-10-CM | POA: Diagnosis not present

## 2021-08-02 DIAGNOSIS — I5189 Other ill-defined heart diseases: Secondary | ICD-10-CM | POA: Diagnosis not present

## 2021-08-02 DIAGNOSIS — I5022 Chronic systolic (congestive) heart failure: Secondary | ICD-10-CM | POA: Insufficient documentation

## 2021-08-02 DIAGNOSIS — N184 Chronic kidney disease, stage 4 (severe): Secondary | ICD-10-CM

## 2021-08-02 DIAGNOSIS — E039 Hypothyroidism, unspecified: Secondary | ICD-10-CM | POA: Diagnosis not present

## 2021-08-02 MED ORDER — FUROSEMIDE 40 MG PO TABS: 40.0000 mg | ORAL_TABLET | Freq: Every day | ORAL | 1 refills | Status: DC

## 2021-08-02 NOTE — Assessment & Plan Note (Signed)
Chronic, stable Continue to recommend once daily lasix to assist LE edema; R>L Use of TED hose recommended; on today Has seen nephro; was reduced from BID to QD dosing

## 2021-08-02 NOTE — Assessment & Plan Note (Signed)
Chronic, previous followed by cardiology Was told to stop metop; however, has been self titrating if BP is 130/140s HR have been low to mid 40s with 25 mg BID Recommend f/u with cardiology for change in agent vs change to XL medication given infrequent dosing; reports she may take 4/14 doses per week

## 2021-08-03 ENCOUNTER — Telehealth: Payer: Self-pay | Admitting: Family Medicine

## 2021-08-03 ENCOUNTER — Other Ambulatory Visit: Payer: Self-pay | Admitting: Family Medicine

## 2021-08-03 DIAGNOSIS — I120 Hypertensive chronic kidney disease with stage 5 chronic kidney disease or end stage renal disease: Secondary | ICD-10-CM | POA: Insufficient documentation

## 2021-08-03 DIAGNOSIS — E039 Hypothyroidism, unspecified: Secondary | ICD-10-CM

## 2021-08-03 LAB — IRON,TIBC AND FERRITIN PANEL
Ferritin: 293 ng/mL — ABNORMAL HIGH (ref 15–150)
Iron Saturation: 32 % (ref 15–55)
Iron: 86 ug/dL (ref 27–139)
Total Iron Binding Capacity: 266 ug/dL (ref 250–450)
UIBC: 180 ug/dL (ref 118–369)

## 2021-08-03 LAB — CBC WITH DIFFERENTIAL/PLATELET
Basophils Absolute: 0 10*3/uL (ref 0.0–0.2)
Basos: 0 %
EOS (ABSOLUTE): 0 10*3/uL (ref 0.0–0.4)
Eos: 0 %
Hematocrit: 32.3 % — ABNORMAL LOW (ref 34.0–46.6)
Hemoglobin: 11.2 g/dL (ref 11.1–15.9)
Immature Grans (Abs): 0 10*3/uL (ref 0.0–0.1)
Immature Granulocytes: 0 %
Lymphocytes Absolute: 0.9 10*3/uL (ref 0.7–3.1)
Lymphs: 18 %
MCH: 32.5 pg (ref 26.6–33.0)
MCHC: 34.7 g/dL (ref 31.5–35.7)
MCV: 94 fL (ref 79–97)
Monocytes Absolute: 0.3 10*3/uL (ref 0.1–0.9)
Monocytes: 6 %
Neutrophils Absolute: 3.5 10*3/uL (ref 1.4–7.0)
Neutrophils: 76 %
Platelets: 192 10*3/uL (ref 150–450)
RBC: 3.45 x10E6/uL — ABNORMAL LOW (ref 3.77–5.28)
RDW: 13.8 % (ref 11.7–15.4)
WBC: 4.7 10*3/uL (ref 3.4–10.8)

## 2021-08-03 LAB — COMPREHENSIVE METABOLIC PANEL
ALT: 10 IU/L (ref 0–32)
AST: 16 IU/L (ref 0–40)
Albumin/Globulin Ratio: 1.7 (ref 1.2–2.2)
Albumin: 4.2 g/dL (ref 3.6–4.6)
Alkaline Phosphatase: 159 IU/L — ABNORMAL HIGH (ref 44–121)
BUN/Creatinine Ratio: 21 (ref 12–28)
BUN: 66 mg/dL — ABNORMAL HIGH (ref 8–27)
Bilirubin Total: 0.4 mg/dL (ref 0.0–1.2)
CO2: 20 mmol/L (ref 20–29)
Calcium: 10.1 mg/dL (ref 8.7–10.3)
Chloride: 97 mmol/L (ref 96–106)
Creatinine, Ser: 3.21 mg/dL — ABNORMAL HIGH (ref 0.57–1.00)
Globulin, Total: 2.5 g/dL (ref 1.5–4.5)
Glucose: 107 mg/dL — ABNORMAL HIGH (ref 70–99)
Potassium: 4 mmol/L (ref 3.5–5.2)
Sodium: 134 mmol/L (ref 134–144)
Total Protein: 6.7 g/dL (ref 6.0–8.5)
eGFR: 14 mL/min/{1.73_m2} — ABNORMAL LOW (ref >59)

## 2021-08-03 LAB — LIPID PANEL
Chol/HDL Ratio: 1.9 ratio (ref 0.0–4.4)
Cholesterol, Total: 126 mg/dL (ref 100–199)
HDL: 66 mg/dL (ref >39)
LDL Chol Calc (NIH): 40 mg/dL (ref 0–99)
Triglycerides: 114 mg/dL (ref 0–149)
VLDL Cholesterol Cal: 20 mg/dL (ref 5–40)

## 2021-08-03 LAB — TSH+FREE T4
Free T4: 1.69 ng/dL (ref 0.82–1.77)
TSH: 0.34 u[IU]/mL — ABNORMAL LOW (ref 0.450–4.500)

## 2021-08-03 LAB — HEMOGLOBIN A1C
Est. average glucose Bld gHb Est-mCnc: 108 mg/dL
Hgb A1c MFr Bld: 5.4 % (ref 4.8–5.6)

## 2021-08-03 MED ORDER — LEVOTHYROXINE SODIUM 88 MCG PO TABS: 88.0000 ug | ORAL_TABLET | Freq: Every day | ORAL | 0 refills | Status: DC

## 2021-08-03 NOTE — Telephone Encounter (Signed)
Noted  

## 2021-08-03 NOTE — Progress Notes (Signed)
Blood chemistry continues to show worsening CKD, previous creatinine 2.7-2.8 now 3.2. Slight decrease in eGFR, continues to show advanced kidney disease. Pt continues to refuse iHD. Advise hospice to assist, if desired.  Elevation in alkaline phosphatase, remains.  Blood counts remains stable. Iron panel remains stable as well.  Need adjustment on thyroid medication; now, over treated.   Cholesterol remains stable; can scale back on statin if desired.  Normal average blood sugar average.  Sherry Long, [address] [place], [place] [address] [phone] (phone) [phone] (fax) [place]

## 2021-08-03 NOTE — Telephone Encounter (Signed)
Sherry Long w/ [facility] making provider aware they received the referral and will have their family liaison speak w/ the patient to explain the difference between hospice and palliative care due to patient being unsure if she needs the hospice care

## 2021-08-08 NOTE — Telephone Encounter (Signed)
Copied from [place] [phone]. Topic: General - Other >> Aug 08, 2021  2:44 PM Rudene Anda wrote: Hospice home health called to let pts PCP know that the pt declined hospice services.

## 2021-09-12 ENCOUNTER — Telehealth: Payer: Self-pay

## 2021-09-12 NOTE — Telephone Encounter (Signed)
Copied from [place] [phone]. Topic: General - Other >> Sep 12, 2021  1:23 PM Ja-Kwan M wrote: Reason for CRM: Pt stated she is suppose to come in for labs. Pt requests that the lab order be placed so she can have her labs drawn. Cb# [phone]

## 2021-09-12 NOTE — Telephone Encounter (Signed)
Which labs do patient need?

## 2021-09-13 ENCOUNTER — Other Ambulatory Visit: Payer: Self-pay | Admitting: Family Medicine

## 2021-09-13 DIAGNOSIS — I120 Hypertensive chronic kidney disease with stage 5 chronic kidney disease or end stage renal disease: Secondary | ICD-10-CM

## 2021-09-13 DIAGNOSIS — E039 Hypothyroidism, unspecified: Secondary | ICD-10-CM

## 2021-09-14 NOTE — Telephone Encounter (Signed)
Left message on patient's vm letting her know labs are ready.

## 2021-09-20 DIAGNOSIS — I120 Hypertensive chronic kidney disease with stage 5 chronic kidney disease or end stage renal disease: Secondary | ICD-10-CM | POA: Diagnosis not present

## 2021-09-20 DIAGNOSIS — E039 Hypothyroidism, unspecified: Secondary | ICD-10-CM | POA: Diagnosis not present

## 2021-09-20 DIAGNOSIS — N185 Chronic kidney disease, stage 5: Secondary | ICD-10-CM | POA: Diagnosis not present

## 2021-09-21 ENCOUNTER — Other Ambulatory Visit: Payer: Self-pay | Admitting: Family Medicine

## 2021-09-21 ENCOUNTER — Telehealth: Payer: Self-pay | Admitting: Family Medicine

## 2021-09-21 DIAGNOSIS — E039 Hypothyroidism, unspecified: Secondary | ICD-10-CM

## 2021-09-21 LAB — COMPREHENSIVE METABOLIC PANEL
ALT: 8 IU/L (ref 0–32)
AST: 19 IU/L (ref 0–40)
Albumin/Globulin Ratio: 1.5 (ref 1.2–2.2)
Albumin: 3.9 g/dL (ref 3.7–4.7)
Alkaline Phosphatase: 145 IU/L — ABNORMAL HIGH (ref 44–121)
BUN/Creatinine Ratio: 14 (ref 12–28)
BUN: 43 mg/dL — ABNORMAL HIGH (ref 8–27)
Bilirubin Total: 0.4 mg/dL (ref 0.0–1.2)
CO2: 18 mmol/L — ABNORMAL LOW (ref 20–29)
Calcium: 10 mg/dL (ref 8.7–10.3)
Chloride: 99 mmol/L (ref 96–106)
Creatinine, Ser: 2.99 mg/dL — ABNORMAL HIGH (ref 0.57–1.00)
Globulin, Total: 2.6 g/dL (ref 1.5–4.5)
Glucose: 106 mg/dL — ABNORMAL HIGH (ref 70–99)
Potassium: 4 mmol/L (ref 3.5–5.2)
Sodium: 137 mmol/L (ref 134–144)
Total Protein: 6.5 g/dL (ref 6.0–8.5)
eGFR: 15 mL/min/{1.73_m2} — ABNORMAL LOW (ref >59)

## 2021-09-21 LAB — TSH+T4F+T3FREE
Free T4: 0.95 ng/dL (ref 0.82–1.77)
T3, Free: 1.9 pg/mL — ABNORMAL LOW (ref 2.0–4.4)
TSH: 13.3 u[IU]/mL — ABNORMAL HIGH (ref 0.450–4.500)

## 2021-09-21 MED ORDER — LEVOTHYROXINE SODIUM 100 MCG PO TABS: 100.0000 ug | ORAL_TABLET | Freq: Every day | ORAL | 1 refills | Status: DC

## 2021-09-21 NOTE — Progress Notes (Signed)
Stable blood chemistry, which is reassuring. Thyroid is now under-treated. Recommend dose change again to assist. Ensure medication is taken with water only, not with other medications and/or food for best absorption.   Gwyneth Sprout, [address] [place], [place] [address] [phone] (phone) [phone] (fax) [place]

## 2021-09-21 NOTE — Telephone Encounter (Signed)
[facility] faxed refill request for the following medications:  allopurinol (ZYLOPRIM) 100 MG tablet   Please advise.

## 2021-09-22 ENCOUNTER — Telehealth: Payer: Self-pay | Admitting: Family Medicine

## 2021-09-22 ENCOUNTER — Other Ambulatory Visit: Payer: Self-pay

## 2021-09-22 DIAGNOSIS — I10 Essential (primary) hypertension: Secondary | ICD-10-CM

## 2021-09-22 DIAGNOSIS — N184 Chronic kidney disease, stage 4 (severe): Secondary | ICD-10-CM

## 2021-09-22 MED ORDER — ALLOPURINOL 100 MG PO TABS: 200.0000 mg | ORAL_TABLET | Freq: Every day | ORAL | 1 refills | Status: DC

## 2021-09-22 MED ORDER — ROSUVASTATIN CALCIUM 10 MG PO TABS: 10.0000 mg | ORAL_TABLET | Freq: Every day | ORAL | 3 refills | Status: DC

## 2021-09-22 NOTE — Telephone Encounter (Signed)
[facility] faxed refill request for the following medications:  allopurinol (ZYLOPRIM) 100 MG tablet   Please advise.

## 2021-09-22 NOTE — Telephone Encounter (Signed)
This was ordered today. allopurinol (ZYLOPRIM) 100 MG tablet 180 tablet 1 09/22/2021    Sig - Route: Take 2 tablets (200 mg total) by mouth daily. - Oral   Sent to pharmacy as: allopurinol (ZYLOPRIM) 100 MG tablet   E-Prescribing Status: Receipt confirmed by pharmacy (09/22/2021  1:23 PM EDT)

## 2021-10-28 DIAGNOSIS — D631 Anemia in chronic kidney disease: Secondary | ICD-10-CM | POA: Diagnosis not present

## 2021-10-28 DIAGNOSIS — N184 Chronic kidney disease, stage 4 (severe): Secondary | ICD-10-CM | POA: Diagnosis not present

## 2021-10-31 DIAGNOSIS — N184 Chronic kidney disease, stage 4 (severe): Secondary | ICD-10-CM | POA: Diagnosis not present

## 2021-11-16 ENCOUNTER — Other Ambulatory Visit: Payer: Self-pay | Admitting: Family Medicine

## 2021-11-16 DIAGNOSIS — E039 Hypothyroidism, unspecified: Secondary | ICD-10-CM

## 2022-01-16 ENCOUNTER — Other Ambulatory Visit: Payer: Self-pay | Admitting: Family Medicine

## 2022-01-16 DIAGNOSIS — E039 Hypothyroidism, unspecified: Secondary | ICD-10-CM

## 2022-01-24 ENCOUNTER — Other Ambulatory Visit: Payer: Self-pay | Admitting: Family Medicine

## 2022-01-24 DIAGNOSIS — I5022 Chronic systolic (congestive) heart failure: Secondary | ICD-10-CM

## 2022-02-23 ENCOUNTER — Telehealth: Payer: Self-pay | Admitting: Family Medicine

## 2022-02-23 NOTE — Telephone Encounter (Signed)
LVM for pt to rtn my call to re-schedule AWV with [facility] on 03/30/22 call back # [phone]

## 2022-02-24 ENCOUNTER — Other Ambulatory Visit: Payer: Self-pay | Admitting: Family Medicine

## 2022-02-27 ENCOUNTER — Telehealth: Payer: Self-pay

## 2022-02-27 NOTE — Telephone Encounter (Signed)
Copied from [place] [phone]. Topic: General - Other >> Feb 27, 2022  3:40 PM Everette C wrote: Reason for CRM: The patient has returned missed phone call from [place]   Please contact further when possible

## 2022-03-01 NOTE — Telephone Encounter (Signed)
Bernice scheduled AWV for patient on 04/04/2022.

## 2022-03-20 ENCOUNTER — Other Ambulatory Visit: Payer: Self-pay | Admitting: Family Medicine

## 2022-03-20 DIAGNOSIS — E039 Hypothyroidism, unspecified: Secondary | ICD-10-CM

## 2022-04-04 ENCOUNTER — Ambulatory Visit (INDEPENDENT_AMBULATORY_CARE_PROVIDER_SITE_OTHER): Payer: Medicare Other

## 2022-04-04 VITALS — Ht 65.0 in | Wt 235.0 lb

## 2022-04-04 DIAGNOSIS — Z0000 Encounter for general adult medical examination without abnormal findings: Secondary | ICD-10-CM

## 2022-04-04 NOTE — Patient Instructions (Signed)
[name] , Thank you for taking time to come for your Medicare Wellness Visit. I appreciate your ongoing commitment to your health goals. Please review the following plan we discussed and let me know if I can assist you in the future.   These are the goals we discussed:  Goals      Cut out extra servings     Recommend to continue cutting out the extra snacking and avoiding junk food.      DIET - EAT MORE FRUITS AND VEGETABLES     Reduce portion size     Recommend decreasing daily portion sizes.        This is a list of the screening recommended for you and due dates:  Health Maintenance  Topic Date Due   COVID-19 Vaccine (1) Never done   Zoster (Shingles) Vaccine (1 of 2) Never done   DTaP/Tdap/Td vaccine (2 - Tdap) 08/08/2008   Flu Shot  Never done   Medicare Annual Wellness Visit  04/05/2023   Pneumonia Vaccine  Completed   HPV Vaccine  Aged Out   DEXA scan (bone density measurement)  Discontinued    Advanced directives: yes  Conditions/risks identified: falls risk  Next appointment: Follow up in one year for your annual wellness visit 04/10/2023 @ 3pm telephone   Preventive Care 65 Years and Older, Female Preventive care refers to lifestyle choices and visits with your health care provider that can promote health and wellness. What does preventive care include? A yearly physical exam. This is also called an annual well check. Dental exams once or twice a year. Routine eye exams. Ask your health care provider how often you should have your eyes checked. Personal lifestyle choices, including: Daily care of your teeth and gums. Regular physical activity. Eating a healthy diet. Avoiding tobacco and drug use. Limiting alcohol use. Practicing safe sex. Taking low-dose aspirin every day. Taking vitamin and mineral supplements as recommended by your health care provider. What happens during an annual well check? The services and screenings done by your health care  provider during your annual well check will depend on your age, overall health, lifestyle risk factors, and family history of disease. Counseling  Your health care provider may ask you questions about your: Alcohol use. Tobacco use. Drug use. Emotional well-being. Home and relationship well-being. Sexual activity. Eating habits. History of falls. Memory and ability to understand (cognition). Work and work Statistician. Reproductive health. Screening  You may have the following tests or measurements: Height, weight, and BMI. Blood pressure. Lipid and cholesterol levels. These may be checked every 5 years, or more frequently if you are over 61 years old. Skin check. Lung cancer screening. You may have this screening every year starting at age 53 if you have a 30-pack-year history of smoking and currently smoke or have quit within the past 15 years. Fecal occult blood test (FOBT) of the stool. You may have this test every year starting at age 37. Flexible sigmoidoscopy or colonoscopy. You may have a sigmoidoscopy every 5 years or a colonoscopy every 10 years starting at age 60. Hepatitis C blood test. Hepatitis B blood test. Sexually transmitted disease (STD) testing. Diabetes screening. This is done by checking your blood sugar (glucose) after you have not eaten for a while (fasting). You may have this done every 1-3 years. Bone density scan. This is done to screen for osteoporosis. You may have this done starting at age 10. Mammogram. This may be done every 1-2 years. Talk  to your health care provider about how often you should have regular mammograms. Talk with your health care provider about your test results, treatment options, and if necessary, the need for more tests. Vaccines  Your health care provider may recommend certain vaccines, such as: Influenza vaccine. This is recommended every year. Tetanus, diphtheria, and acellular pertussis (Tdap, Td) vaccine. You may need a Td  booster every 10 years. Zoster vaccine. You may need this after age 57. Pneumococcal 13-valent conjugate (PCV13) vaccine. One dose is recommended after age 87. Pneumococcal polysaccharide (PPSV23) vaccine. One dose is recommended after age 35. Talk to your health care provider about which screenings and vaccines you need and how often you need them. This information is not intended to replace advice given to you by your health care provider. Make sure you discuss any questions you have with your health care provider. Document Released: 02/19/2015 Document Revised: 10/13/2015 Document Reviewed: 11/24/2014 Elsevier Interactive Patient Education  2017 [place] Prevention in the Home Falls can cause injuries. They can happen to people of all ages. There are many things you can do to make your home safe and to help prevent falls. What can I do on the outside of my home? Regularly fix the edges of walkways and driveways and fix any cracks. Remove anything that might make you trip as you walk through a door, such as a raised step or threshold. Trim any bushes or trees on the path to your home. Use bright outdoor lighting. Clear any walking paths of anything that might make someone trip, such as rocks or tools. Regularly check to see if handrails are loose or broken. Make sure that both sides of any steps have handrails. Any raised decks and porches should have guardrails on the edges. Have any leaves, snow, or ice cleared regularly. Use sand or salt on walking paths during winter. Clean up any spills in your garage right away. This includes oil or grease spills. What can I do in the bathroom? Use night lights. Install grab bars by the toilet and in the tub and shower. Do not use towel bars as grab bars. Use non-skid mats or decals in the tub or shower. If you need to sit down in the shower, use a plastic, non-slip stool. Keep the floor dry. Clean up any water that spills on the floor  as soon as it happens. Remove soap buildup in the tub or shower regularly. Attach bath mats securely with double-sided non-slip rug tape. Do not have throw rugs and other things on the floor that can make you trip. What can I do in the bedroom? Use night lights. Make sure that you have a light by your bed that is easy to reach. Do not use any sheets or blankets that are too big for your bed. They should not hang down onto the floor. Have a firm chair that has side arms. You can use this for support while you get dressed. Do not have throw rugs and other things on the floor that can make you trip. What can I do in the kitchen? Clean up any spills right away. Avoid walking on wet floors. Keep items that you use a lot in easy-to-reach places. If you need to reach something above you, use a strong step stool that has a grab bar. Keep electrical cords out of the way. Do not use floor polish or wax that makes floors slippery. If you must use wax, use non-skid floor wax. Do  not have throw rugs and other things on the floor that can make you trip. What can I do with my stairs? Do not leave any items on the stairs. Make sure that there are handrails on both sides of the stairs and use them. Fix handrails that are broken or loose. Make sure that handrails are as long as the stairways. Check any carpeting to make sure that it is firmly attached to the stairs. Fix any carpet that is loose or worn. Avoid having throw rugs at the top or bottom of the stairs. If you do have throw rugs, attach them to the floor with carpet tape. Make sure that you have a light switch at the top of the stairs and the bottom of the stairs. If you do not have them, ask someone to add them for you. What else can I do to help prevent falls? Wear shoes that: Do not have high heels. Have rubber bottoms. Are comfortable and fit you well. Are closed at the toe. Do not wear sandals. If you use a stepladder: Make sure that it is  fully opened. Do not climb a closed stepladder. Make sure that both sides of the stepladder are locked into place. Ask someone to hold it for you, if possible. Clearly mark and make sure that you can see: Any grab bars or handrails. First and last steps. Where the edge of each step is. Use tools that help you move around (mobility aids) if they are needed. These include: Canes. Walkers. Scooters. Crutches. Turn on the lights when you go into a dark area. Replace any light bulbs as soon as they burn out. Set up your furniture so you have a clear path. Avoid moving your furniture around. If any of your floors are uneven, fix them. If there are any pets around you, be aware of where they are. Review your medicines with your doctor. Some medicines can make you feel dizzy. This can increase your chance of falling. Ask your doctor what other things that you can do to help prevent falls. This information is not intended to replace advice given to you by your health care provider. Make sure you discuss any questions you have with your health care provider. Document Released: 11/19/2008 Document Revised: 07/01/2015 Document Reviewed: 02/27/2014 Elsevier Interactive Patient Education  2017 [employer].

## 2022-04-04 NOTE — Progress Notes (Signed)
I connected with  Stevphen Meuse on 04/04/22 by a audio/telephone and verified that I am speaking with the correct person using two identifiers.  Patient Location: Home  Provider Location: Office/Clinic  I discussed the limitations of evaluation and management by telemedicine. The patient expressed understanding and agreed to proceed.  Subjective:   Sherry Long is a 82 y.o. female who presents for Medicare Annual (Subsequent) preventive examination.  Review of Systems    Cardiac Risk Factors include: advanced age (>12mn, >>28women);hypertension;obesity (BMI >30kg/m2);sedentary lifestyle    Objective:    Today's Vitals   04/04/22 1519  Weight: 235 lb (106.6 kg)  Height: '5\' 5"'$  (1.651 m)   Body mass index is 39.11 kg/m.     04/04/2022    3:34 PM 03/29/2021    3:47 PM 12/30/2019   10:12 AM 12/09/2019    8:24 AM 07/09/2019   10:13 AM 07/08/2018   10:09 AM 07/04/2017   10:29 AM  Advanced Directives  Does Patient Have a Medical Advance Directive? Yes No Yes Yes Yes Yes No  Type of AParamedicof AHaslettLiving will  HAlachuaLiving will HCusterLiving will HBayou L'OurseLiving will HStephensonLiving will   Does patient want to make changes to medical advance directive?   No - Patient declined No - Patient declined     Copy of HGlasgowin Chart? No - copy requested  Yes - validated most recent copy scanned in chart (See row information) No - copy requested Yes - validated most recent copy scanned in chart (See row information) Yes - validated most recent copy scanned in chart (See row information)   Would patient like information on creating a medical advance directive?  No - Patient declined     Yes (MAU/Ambulatory/Procedural Areas - Information given)    Current Medications (verified) Outpatient Encounter Medications as of 04/04/2022  Medication Sig   acetaminophen  (TYLENOL) 500 MG tablet Take 500 mg by mouth as needed.   allopurinol (ZYLOPRIM) 100 MG tablet Take 2 tablets (200 mg total) by mouth daily.   aspirin 81 MG tablet Take 81 mg by mouth daily.    furosemide (LASIX) 40 MG tablet TAKE 1 TABLET BY MOUTH DAILY   ketoconazole (NIZORAL) 2 % cream Apply 1 application topically daily as needed.   levothyroxine (SYNTHROID) 100 MCG tablet TAKE 1 TABLET BY MOUTH DAILY   metoprolol tartrate (LOPRESSOR) 25 MG tablet TAKE 1 TABLET BY MOUTH TWICE A DAY   rosuvastatin (CRESTOR) 10 MG tablet Take 1 tablet (10 mg total) by mouth daily.   Ascorbic Acid (VITAMIN C PO) Take by mouth daily. (Patient not taking: Reported on 04/04/2022)   Multiple Vitamins-Iron (MULTIVITAMIN PLUS IRON ADULT PO) Take by mouth daily. (Patient not taking: Reported on 04/04/2022)   No facility-administered encounter medications on file as of 04/04/2022.    Allergies (verified) Aspirin and Hydrochlorothiazide   History: Past Medical History:  Diagnosis Date   Arthritis    lumbar spine   Chronic kidney disease (CKD), stage IV (severe) (HCC)    Gout    Hypertension    Hypothyroidism    Morbid obesity ([place]    Sciatica    right   Wears dentures    full upper   Past Surgical History:  Procedure Laterality Date   CATARACT EXTRACTION W/PHACO Right 12/09/2019   Procedure: CATARACT EXTRACTION PHACO AND INTRAOCULAR LENS PLACEMENT (IOC) RIGHT 4.79 00:37.7;  Surgeon: Birder Robson, MD;  Location: [facility];  Service: Ophthalmology;  Laterality: Right;   CATARACT EXTRACTION W/PHACO Left 12/30/2019   Procedure: CATARACT EXTRACTION PHACO AND INTRAOCULAR LENS PLACEMENT (IOC) LEFT 5.75 00:35.6 ;  Surgeon: Birder Robson, MD;  Location: [facility];  Service: Ophthalmology;  Laterality: Left;   COLONOSCOPY     TONSILLECTOMY AND ADENOIDECTOMY  1948   Family History  Problem Relation Age of Onset   Heart disease Mother    Alzheimer's disease Mother    Stroke Father     Hypertension Sister    Heart disease Sister    Heart disease Maternal Grandmother    Stroke Maternal Grandmother    Colon cancer Other    Social History   Socioeconomic History   Marital status: Widowed    Spouse name: Not on file   Number of children: 1   Years of education: Not on file   Highest education level: Associate degree: occupational, Hotel manager, or vocational program  Occupational History   Occupation: retired  Tobacco Use   Smoking status: Former    Types: Cigarettes    Quit date: 02/06/1976    Years since quitting: 46.1   Smokeless tobacco: Never   Tobacco comments:    quit in 20's  Vaping Use   Vaping Use: Never used  Substance and Sexual Activity   Alcohol use: No   Drug use: No   Sexual activity: Not on file  Other Topics Concern   Not on file  Social History Narrative   1 son - deceased   Social Determinants of Health   Financial Resource Strain: [place]  (04/04/2022)   Overall Financial Resource Strain (CARDIA)    Difficulty of Paying Living Expenses: Not hard at all  Food Insecurity: No Food Insecurity (04/04/2022)   Hunger Vital Sign    Worried About Running Out of Food in the Last Year: Never true    [place] in the Last Year: Never true  Transportation Needs: No Transportation Needs (04/04/2022)   PRAPARE - Hydrologist (Medical): No    Lack of Transportation (Non-Medical): No  Physical Activity: Insufficiently Active (04/04/2022)   Exercise Vital Sign    Days of Exercise per Week: 7 days    Minutes of Exercise per Session: 20 min  Stress: No Stress Concern Present (04/04/2022)   [place]    Feeling of Stress : Not at all  Social Connections: Socially Isolated (04/04/2022)   Social Connection and Isolation Panel [NHANES]    Frequency of Communication with Friends and Family: More than three times a week    Frequency of Social Gatherings  with Friends and Family: Never    Attends Religious Services: Never    Marine scientist or Organizations: No    Attends Archivist Meetings: Never    Marital Status: Widowed    Tobacco Counseling Counseling given: Not Answered Tobacco comments: quit in 20's   Clinical Intake:  Pre-visit preparation completed: Yes  Pain : No/denies pain     BMI - recorded: 39.11 Nutritional Status: BMI > 30  Obese Nutritional Risks: None Diabetes: No  How often do you need to have someone help you when you read instructions, pamphlets, or other written materials from your doctor or pharmacy?: 1 - Never  Diabetic?no  Interpreter Needed?: No  Information entered by :: B.Harley Fitzwater,LPN   Activities of Daily Living  04/04/2022    3:35 PM 08/02/2021    4:05 PM  In your present state of health, do you have any difficulty performing the following activities:  Hearing? 0 0  Vision? 0 1  Difficulty concentrating or making decisions? 0 0  Walking or climbing stairs? 1 1  Dressing or bathing? 0 0  Doing errands, shopping? 1 1  Comment does not drive anymore;family assits in errands   Preparing Food and eating ? N   Using the Toilet? N   In the past six months, have you accidently leaked urine? N   Do you have problems with loss of bowel control? N   Managing your Medications? N   Managing your Finances? N   Housekeeping or managing your Housekeeping? N     Patient Care Team: Gwyneth Sprout, FNP as PCP - General (Family Medicine) Horald Chestnut, DO as Consulting Physician (Nephrology)  Indicate any recent Medical Services you may have received from other than [facility] providers in the past year (date may be approximate).     Assessment:   This is a routine wellness examination for Sherry Long.  Hearing/Vision screen Hearing Screening - Comments:: Adequate hearing Vision Screening - Comments:: Adequate after cataract surgery and glasses Dr Gloriann Loan  Dietary issues and exercise  activities discussed: Current Exercise Habits: Home exercise routine, Type of exercise: exercise ball;stretching;strength training/weights, Time (Minutes): 20, Frequency (Times/Week): 7, Weekly Exercise (Minutes/Week): 140, Exercise limited by: orthopedic condition(s) (arthritis)   Goals Addressed             This Visit's Progress    Cut out extra servings   On track    Recommend to continue cutting out the extra snacking and avoiding junk food.      DIET - EAT MORE FRUITS AND VEGETABLES   On track    Reduce portion size   On track    Recommend decreasing daily portion sizes.       Depression Screen    04/04/2022    3:29 PM 08/02/2021    4:04 PM 03/29/2021    3:44 PM 07/09/2019   10:08 AM 07/08/2018   10:09 AM 07/04/2017   10:33 AM 07/04/2017   10:29 AM  PHQ 2/9 Scores  PHQ - 2 Score 0 0 0 0 0 1 1  PHQ- 9 Score  4    5     Fall Risk    04/04/2022    3:22 PM 08/02/2021    4:04 PM 03/29/2021    3:48 PM 07/09/2019   10:13 AM 07/08/2018   10:09 AM  Fall Risk   Falls in the past year? 0 0 0 0 0  Number falls in past yr: 0 0 0 0   Injury with Fall? 0 0 0 0   Risk for fall due to : No Fall Risks  No Fall Risks    Follow up Falls prevention discussed;Education provided  Falls evaluation completed      FALL RISK PREVENTION PERTAINING TO THE HOME:  Any stairs in or around the home? Yes  If so, are there any without handrails? Yes  Home free of loose throw rugs in walkways, pet beds, electrical cords, etc? Yes  Adequate lighting in your home to reduce risk of falls? Yes   ASSISTIVE DEVICES UTILIZED TO PREVENT FALLS:  Life alert? No had bracelet:does not wear anymore as only protects in the home.keeps phone in pocket at all times Use of a cane, walker or w/c? Yes cane at [facility]  outside home Grab bars in the bathroom? Yes  Shower chair or bench in shower? No  Elevated toilet seat or a handicapped toilet? Yes   Cognitive Function:        04/04/2022    3:40 PM 07/09/2019    10:18 AM 07/08/2018   10:18 AM 06/29/2016    2:03 PM  6CIT Screen  What Year? 0 points 0 points 0 points 0 points  What month? 0 points 0 points 0 points 0 points  What time? 0 points 0 points 0 points 0 points  Count back from 20 0 points 0 points 0 points 0 points  Months in reverse 0 points 0 points 0 points 0 points  Repeat phrase 0 points 0 points 0 points 0 points  Total Score 0 points 0 points 0 points 0 points    Immunizations Immunization History  Administered Date(s) Administered   Pneumococcal Conjugate-13 06/22/2014   Pneumococcal Polysaccharide-23 06/23/2015   Td 08/09/1998    TDAP status: Up to date  Flu Vaccine status: Declined, Education has been provided regarding the importance of this vaccine but patient still declined. Advised may receive this vaccine at local pharmacy or Health Dept. Aware to provide a copy of the vaccination record if obtained from local pharmacy or Health Dept. Verbalized acceptance and understanding.  Pneumococcal vaccine status: Up to date  Covid-19 vaccine status: Declined, Education has been provided regarding the importance of this vaccine but patient still declined. Advised may receive this vaccine at local pharmacy or Health Dept.or vaccine clinic. Aware to provide a copy of the vaccination record if obtained from local pharmacy or Health Dept. Verbalized acceptance and understanding.  Qualifies for Shingles Vaccine? Yes   Zostavax completed No   Shingrix Completed?: No.    Education has been provided regarding the importance of this vaccine. Patient has been advised to call insurance company to determine out of pocket expense if they have not yet received this vaccine. Advised may also receive vaccine at local pharmacy or Health Dept. Verbalized acceptance and understanding.  Screening Tests Health Maintenance  Topic Date Due   COVID-19 Vaccine (1) Never done   Zoster Vaccines- Shingrix (1 of 2) Never done   DTaP/Tdap/Td (2 - Tdap)  08/08/2008   INFLUENZA VACCINE  Never done   Medicare Annual Wellness (AWV)  04/05/2023   Pneumonia Vaccine 46+ Years old  Completed   HPV VACCINES  Aged Out   DEXA SCAN  Discontinued    Health Maintenance  Health Maintenance Due  Topic Date Due   COVID-19 Vaccine (1) Never done   Zoster Vaccines- Shingrix (1 of 2) Never done   DTaP/Tdap/Td (2 - Tdap) 08/08/2008   INFLUENZA VACCINE  Never done    Colorectal cancer screening: No longer required.   Mammogram status: No longer required due to age.  Bone Density-Never done  Lung Cancer Screening: (Low Dose CT Chest recommended if Age 58-80 years, 30 pack-year currently smoking OR have quit w/in 15years.) does not qualify.   Lung Cancer Screening Referral: no  Additional Screening:  Hepatitis C Screening: does not qualify; Completed no  Vision Screening: Recommended annual ophthalmology exams for early detection of glaucoma and other disorders of the eye. Is the patient up to date with their annual eye exam?  Yes  Who is the provider or what is the name of the office in which the patient attends annual eye exams? Dr Gloriann Loan If pt is not established with a provider, would they like to be  referred to a provider to establish care? No .   Dental Screening: Recommended annual dental exams for proper oral hygiene  Community Resource Referral / Chronic Care Management: CRR required this visit?  No   CCM required this visit?  No      Plan:     I have personally reviewed and noted the following in the patient's chart:   Medical and social history Use of alcohol, tobacco or illicit drugs  Current medications and supplements including opioid prescriptions. Patient is not currently taking opioid prescriptions. Functional ability and status Nutritional status Physical activity Advanced directives List of other physicians Hospitalizations, surgeries, and ER visits in previous 12 months Vitals Screenings to include cognitive,  depression, and falls Referrals and appointments  In addition, I have reviewed and discussed with patient certain preventive protocols, quality metrics, and best practice recommendations. A written personalized care plan for preventive services as well as general preventive health recommendations were provided to patient.     Roger Shelter, LPN   [id number]   Nurse Notes: pt states she is doing well:continues to manage home needs independently. Pt relays she has stopped driving and depends on family to take her where she needs to go. Pt has no concerns or questions at this time.

## 2022-05-17 ENCOUNTER — Other Ambulatory Visit: Payer: Self-pay | Admitting: Family Medicine

## 2022-05-18 ENCOUNTER — Other Ambulatory Visit: Payer: Self-pay | Admitting: Family Medicine

## 2022-05-18 DIAGNOSIS — E039 Hypothyroidism, unspecified: Secondary | ICD-10-CM

## 2022-05-18 NOTE — Telephone Encounter (Signed)
Requested medications are due for refill today.  yes  Requested medications are on the active medications list.  yes  Last refill. 03/20/2022 #30 1 rf  Future visit scheduled.   no  Notes to clinic.  Abnormal labs.    Requested Prescriptions  Pending Prescriptions Disp Refills   levothyroxine (SYNTHROID) 100 MCG tablet [Pharmacy Med Name: LEVOTHYROXINE SODIUM 100 MCG TAB] 30 tablet 1    Sig: TAKE 1 TABLET BY MOUTH DAILY     Endocrinology:  Hypothyroid Agents Failed - 05/18/2022  2:00 PM      Failed - TSH in normal range and within 360 days    TSH  Date Value Ref Range Status  09/20/2021 13.300 (H) 0.450 - 4.500 uIU/mL Final         Passed - Valid encounter within last 12 months    Recent Outpatient Visits           9 months ago Chronic kidney disease, stage IV (severe) (HCC)   [place] [facility] Jacky Kindle, FNP   1 year ago Essential hypertension   [facility] Merita Norton T, FNP   1 year ago Chronic kidney disease, stage IV (severe) [facility])   [place] [facility] Jacky Kindle, FNP   2 years ago Annual physical exam   [facility] [place], Alessandra Bevel[place], [place]   3 years ago Annual physical exam   [facility] [place], [place], [place]

## 2022-05-18 NOTE — Telephone Encounter (Signed)
Requested medication (s) are due for refill today: yes  Requested medication (s) are on the active medication list: yes  Last refill:  09/22/21 #180/1  Future visit scheduled: no  Notes to clinic:  no uric acid level on file. Unable to refill per protocol due to failed labs, no updated results.    Requested Prescriptions  Pending Prescriptions Disp Refills   allopurinol (ZYLOPRIM) 100 MG tablet [Pharmacy Med Name: ALLOPURINOL 100 MG TAB] 180 tablet 1    Sig: TAKE 2 TABLETS BY MOUTH DAILY     Endocrinology:  Gout Agents - allopurinol Failed - 05/17/2022  2:54 PM      Failed - Uric Acid in normal range and within 360 days    No results found for: "POCURA", "LABURIC"       Failed - Cr in normal range and within 360 days    Creatinine, Ser  Date Value Ref Range Status  09/20/2021 2.99 (H) 0.57 - 1.00 mg/dL Final         Passed - Valid encounter within last 12 months    Recent Outpatient Visits           9 months ago Chronic kidney disease, stage IV (severe) (HCC)   [place] [facility] Merita Norton T, FNP   1 year ago Essential hypertension   [place] [facility] Merita Norton T, FNP   1 year ago Chronic kidney disease, stage IV (severe) (HCC)   [place] [facility] Merita Norton T, FNP   2 years ago Annual physical exam   [facility] Joycelyn Man M, [place]   3 years ago Annual physical exam   [facility] [place], [place] Dike M, [place]              Passed - CBC within normal limits and completed in the last 12 months    WBC  Date Value Ref Range Status  08/02/2021 4.7 3.4 - 10.8 x10E3/uL Final   RBC  Date Value Ref Range Status  08/02/2021 3.45 (L) 3.77 - 5.28 x10E6/uL Final   Hemoglobin  Date Value Ref Range Status  08/02/2021 11.2 11.1 - 15.9 g/dL Final   Hematocrit  Date Value Ref Range Status  08/02/2021 32.3 (L) 34.0 - 46.6 % Final   MCHC  Date  Value Ref Range Status  08/02/2021 34.7 31.5 - 35.7 g/dL Final   [facility]  Date Value Ref Range Status  08/02/2021 32.5 26.6 - 33.0 pg Final   MCV  Date Value Ref Range Status  08/02/2021 94 79 - 97 fL Final   No results found for: "PLTCOUNTKUC", "LABPLAT", "POCPLA" RDW  Date Value Ref Range Status  08/02/2021 13.8 11.7 - 15.4 % Final

## 2022-05-22 ENCOUNTER — Telehealth: Payer: Self-pay | Admitting: Family Medicine

## 2022-05-22 DIAGNOSIS — E039 Hypothyroidism, unspecified: Secondary | ICD-10-CM

## 2022-05-22 NOTE — Telephone Encounter (Addendum)
Medication Refill - Medication: allopurinol (ZYLOPRIM) 100 MG tablet , levothyroxine (SYNTHROID) 100 MCG tablet   Pt called in and stated she was unaware she needed an appointment. Pt stated she is no longer driving and needs time in advance to figure out or schedule transportation. Pt scheduled appointment for May 7th asked if it was possible to get a courtesy refill to get her through until her appointment.  Please advise.   Has the patient contacted their pharmacy? Yes.   why and proceed with request.) (Agent: If yes, when and what did the pharmacy advise?)  Preferred Pharmacy (with phone number or street name):  [facility] - [place], [place] - [address]  [address] [place] [address]  Phone: [phone] Fax: [phone]  Hours: Not open 24 hours   Has the patient been seen for an appointment in the last year OR does the patient have an upcoming appointment? Yes.    Agent: Please be advised that RX refills may take up to 3 business days. We ask that you follow-up with your pharmacy.

## 2022-05-23 ENCOUNTER — Other Ambulatory Visit: Payer: Self-pay | Admitting: Family Medicine

## 2022-05-23 ENCOUNTER — Telehealth: Payer: Self-pay

## 2022-05-23 DIAGNOSIS — Z5982 Transportation insecurity: Secondary | ICD-10-CM

## 2022-05-23 DIAGNOSIS — E039 Hypothyroidism, unspecified: Secondary | ICD-10-CM

## 2022-05-23 DIAGNOSIS — I5022 Chronic systolic (congestive) heart failure: Secondary | ICD-10-CM

## 2022-05-23 DIAGNOSIS — N184 Chronic kidney disease, stage 4 (severe): Secondary | ICD-10-CM

## 2022-05-23 MED ORDER — ALLOPURINOL 100 MG PO TABS: 50.0000 mg | ORAL_TABLET | ORAL | 0 refills | Status: DC

## 2022-05-23 MED ORDER — LEVOTHYROXINE SODIUM 100 MCG PO TABS: 100.0000 ug | ORAL_TABLET | Freq: Every day | ORAL | 0 refills | Status: DC

## 2022-05-25 ENCOUNTER — Telehealth: Payer: Self-pay | Admitting: *Deleted

## 2022-05-25 NOTE — Telephone Encounter (Signed)
   Telephone encounter was:  Unsuccessful.  05/25/2022 Name: Sherry Long MRN: [id number] DOB: 30-Mar-1940  Unsuccessful outbound call made today to assist with:  Transportation Needs   Outreach Attempt:  1st Attempt  A HIPAA compliant voice message was left requesting a return call.  Instructed patient to call back at [phone].  Yehuda Mao Greenauer -[facility] [place], Population Health [phone] [address] [place] , [place] [address] Email : [email or web address]

## 2022-05-30 ENCOUNTER — Telehealth: Payer: Self-pay | Admitting: *Deleted

## 2022-05-30 NOTE — Telephone Encounter (Signed)
   Telephone encounter was:  Unsuccessful.  05/30/2022 Name: Sherry Long MRN: [id number] DOB: 1940/06/04  Unsuccessful outbound call made today to assist with:  Transportation Needs   Outreach Attempt:  2nd Attempt  A HIPAA compliant voice message was left requesting a return call.  Instructed patient to call back at [phone].  Yehuda Mao Greenauer -[facility] [place], Population Health [phone] [address] [place] , [place] [address] Email : [email or web address]

## 2022-05-31 ENCOUNTER — Telehealth: Payer: Self-pay | Admitting: *Deleted

## 2022-06-05 ENCOUNTER — Telehealth: Payer: Self-pay | Admitting: *Deleted

## 2022-06-07 ENCOUNTER — Telehealth: Payer: Self-pay | Admitting: *Deleted

## 2022-06-07 NOTE — Telephone Encounter (Signed)
   Telephone encounter was:  Unsuccessful.  06/07/2022 Name: Sherry Long MRN: [id number] DOB: 1940-07-08  Unsuccessful outbound call made today to assist with:  Transportation Needs   Outreach Attempt:  2nd Attempt  A HIPAA compliant voice message was left requesting a return call.  Instructed patient to call back at [phone].  Yehuda Mao Greenauer -[facility] [place], Population Health [phone] [address] [place] , [place] [address] Email : [email or web address]

## 2022-06-13 ENCOUNTER — Encounter: Payer: Self-pay | Admitting: Family Medicine

## 2022-06-13 ENCOUNTER — Ambulatory Visit (INDEPENDENT_AMBULATORY_CARE_PROVIDER_SITE_OTHER): Payer: Medicare Other | Admitting: Family Medicine

## 2022-06-13 VITALS — BP 134/84 | HR 52 | Temp 96.7°F | Resp 14 | Ht 65.0 in | Wt 234.8 lb

## 2022-06-13 DIAGNOSIS — N185 Chronic kidney disease, stage 5: Secondary | ICD-10-CM | POA: Diagnosis not present

## 2022-06-13 DIAGNOSIS — D649 Anemia, unspecified: Secondary | ICD-10-CM | POA: Insufficient documentation

## 2022-06-13 DIAGNOSIS — R7309 Other abnormal glucose: Secondary | ICD-10-CM

## 2022-06-13 DIAGNOSIS — N184 Chronic kidney disease, stage 4 (severe): Secondary | ICD-10-CM | POA: Diagnosis not present

## 2022-06-13 DIAGNOSIS — E039 Hypothyroidism, unspecified: Secondary | ICD-10-CM | POA: Insufficient documentation

## 2022-06-13 DIAGNOSIS — I5022 Chronic systolic (congestive) heart failure: Secondary | ICD-10-CM

## 2022-06-13 DIAGNOSIS — I120 Hypertensive chronic kidney disease with stage 5 chronic kidney disease or end stage renal disease: Secondary | ICD-10-CM

## 2022-06-13 DIAGNOSIS — R42 Dizziness and giddiness: Secondary | ICD-10-CM

## 2022-06-13 DIAGNOSIS — D508 Other iron deficiency anemias: Secondary | ICD-10-CM

## 2022-06-13 NOTE — Assessment & Plan Note (Signed)
Chronic, stable Likely in setting of CHF, edema and bradycardia; continue to recommend titration of BB with cardiology given previous labile Bps

## 2022-06-13 NOTE — Assessment & Plan Note (Signed)
Chronic, in setting of CKD Followed by both nephrology and cardiology Repeat labs Denies bleeding or bruising; endorses coolness and fatigue

## 2022-06-13 NOTE — Progress Notes (Signed)
Established patient visit  Patient: Sherry Long   DOB: 1940-03-16   82 y.o. Female  MRN: [id number] Visit Date: 06/13/2022  Today's healthcare provider: Jacky Kindle, FNP   Introduced to nurse practitioner role and practice setting.  All questions answered.  Discussed provider/patient relationship and expectations.  Chief Complaint  Patient presents with   Hypertension   Hyperlipidemia   Hypothyroidism   Subjective    HPI  patient states concern of lack of taste. Hypertension, follow-up  BP Readings from Last 3 Encounters:  06/13/22 134/84  08/02/21 134/82  12/24/20 (!) 163/83   Wt Readings from Last 3 Encounters:  06/13/22 234 lb 12.8 oz (106.5 kg)  04/04/22 235 lb (106.6 kg)  08/02/21 240 lb 9.6 oz (109.1 kg)     She was last seen for hypertension 11 months ago.  BP at that visit was 134/82. Management since that visit includes no changes.  She reports good compliance with treatment. She is not having side effects.  She does not smoke.  Outside blood pressures are {Patient reports last measurment yesterday was 143/70. Symptoms: No chest pain No chest pressure  No palpitations No syncope  No dyspnea No orthopnea  No paroxysmal nocturnal dyspnea No lower extremity edema   Pertinent labs Lab Results  Component Value Date   CHOL 126 08/02/2021   HDL 66 08/02/2021   LDLCALC 40 08/02/2021   TRIG 114 08/02/2021   CHOLHDL 1.9 08/02/2021   Lab Results  Component Value Date   NA 137 09/20/2021   K 4.0 09/20/2021   CREATININE 2.99 (H) 09/20/2021   EGFR 15 (L) 09/20/2021   GLUCOSE 106 (H) 09/20/2021   TSH 13.300 (H) 09/20/2021     The ASCVD Risk score (Arnett DK, et al., 2019) failed to calculate for the following reasons:   The 2019 ASCVD risk score is only valid for ages 7 to 67  ---------------------------------------------------------------------------------------------------  Lipid/Cholesterol, Follow-up  Last lipid panel Other pertinent labs  Lab  Results  Component Value Date   CHOL 126 08/02/2021   HDL 66 08/02/2021   LDLCALC 40 08/02/2021   TRIG 114 08/02/2021   CHOLHDL 1.9 08/02/2021   Lab Results  Component Value Date   ALT 8 09/20/2021   AST 19 09/20/2021   PLT 192 08/02/2021   TSH 13.300 (H) 09/20/2021     She was last seen for this 11 months ago.  Management since that visit includes no changes.  She reports good compliance with treatment. She is not having side effects.  Symptoms: No chest pain No chest pressure/discomfort  No dyspnea No lower extremity edema  No numbness or tingling of extremity No orthopnea  No palpitations No paroxysmal nocturnal dyspnea  No speech difficulty No syncope    The ASCVD Risk score (Arnett DK, et al., 2019) failed to calculate for the following reasons:   The 2019 ASCVD risk score is only valid for ages 45 to 31  ---------------------------------------------------------------------------------------------------  Hypothyroid, follow-up  Lab Results  Component Value Date   TSH 13.300 (H) 09/20/2021   TSH 0.340 (L) 08/02/2021   TSH 1.390 12/17/2020   FREET4 0.95 09/20/2021   FREET4 1.69 08/02/2021    Wt Readings from Last 3 Encounters:  06/13/22 234 lb 12.8 oz (106.5 kg)  04/04/22 235 lb (106.6 kg)  08/02/21 240 lb 9.6 oz (109.1 kg)    She was last seen for hypothyroid 11 months ago.  Management since that visit includes levothyroxine increased from 88 to  daily. She reports good compliance with treatment. She is not having side effects.  Symptoms: Yes change in energy level No constipation  No diarrhea No heat / cold intolerance  No nervousness No palpitations  No weight changes    -----------------------------------------------------------------------------------------  Medications: Outpatient Medications Prior to Visit  Medication Sig   acetaminophen (TYLENOL) 500 MG tablet Take 500 mg by mouth as needed.   allopurinol (ZYLOPRIM) 100 MG tablet Take  0.5 tablets (50 mg total) by mouth 2 (two) times a week.   aspirin 81 MG tablet Take 81 mg by mouth daily.    diphenhydrAMINE (BENADRYL) 25 mg capsule Take 25 mg by mouth once.   furosemide (LASIX) 40 MG tablet TAKE 1 TABLET BY MOUTH DAILY   ketoconazole (NIZORAL) 2 % cream Apply 1 application topically daily as needed.   levothyroxine (SYNTHROID) 100 MCG tablet Take 1 tablet (100 mcg total) by mouth daily.   metoprolol tartrate (LOPRESSOR) 25 MG tablet TAKE 1 TABLET BY MOUTH TWICE A DAY   Multiple Vitamins-Iron (MULTIVITAMIN PLUS IRON ADULT PO) Take by mouth daily.   rosuvastatin (CRESTOR) 10 MG tablet Take 1 tablet (10 mg total) by mouth daily.   Ascorbic Acid (VITAMIN C PO) Take by mouth daily. (Patient not taking: Reported on 04/04/2022)   No facility-administered medications prior to visit.    Review of Systems    Objective    BP 134/84 (BP Location: Left Arm, Patient Position: Sitting, Cuff Size: Large)   Pulse (!) 52   Temp (!) 96.7 F (35.9 C) (Temporal)   Resp 14   Ht 5\' 5"  (1.651 m)   Wt 234 lb 12.8 oz (106.5 kg)   SpO2 99%   BMI 39.07 kg/m   Physical Exam Vitals and nursing note reviewed.  Constitutional:      General: She is not in acute distress.    Appearance: Normal appearance. She is obese. She is not ill-appearing, toxic-appearing or diaphoretic.  HENT:     Head: Normocephalic and atraumatic.  Cardiovascular:     Rate and Rhythm: Normal rate and regular rhythm.     Pulses: Normal pulses.     Heart sounds: Normal heart sounds. No murmur heard.    No friction rub. No gallop.  Pulmonary:     Effort: Pulmonary effort is normal. No respiratory distress.     Breath sounds: Normal breath sounds. No stridor. No wheezing, rhonchi or rales.  Chest:     Chest wall: No tenderness.  Musculoskeletal:        General: Swelling present. No tenderness, deformity or signs of injury. Normal range of motion.     Right lower leg: Edema present.     Left lower leg: Edema  present.  Skin:    General: Skin is warm and dry.     Capillary Refill: Capillary refill takes less than 2 seconds.     Coloration: Skin is not jaundiced or pale.     Findings: No bruising, erythema, lesion or rash.  Neurological:     General: No focal deficit present.     Mental Status: She is alert and oriented to person, place, and time. Mental status is at baseline.     Cranial Nerves: No cranial nerve deficit.     Sensory: No sensory deficit.     Motor: No weakness.     Coordination: Coordination normal.  Psychiatric:        Mood and Affect: Mood normal.        Behavior: Behavior  normal.        Thought Content: Thought content normal.        Judgment: Judgment normal.     No results found for any visits on 06/13/22.  Assessment & Plan     Problem List Items Addressed This Visit       Cardiovascular and Mediastinum   Chronic HFrEF (heart failure with reduced ejection fraction) (HCC)    Chronic, stable Has an upcoming appt with nephrology Reports 64 oz of water/day Repeat CMP and CBC       Relevant Orders   Comprehensive Metabolic Panel (CMET)   CBC with Differential/Platelet     Endocrine   Acquired hypothyroidism - Primary    Chronic, previously under treated Recommend labs given previous TSH of 13.3      Relevant Orders   TSH + free T4     Genitourinary   Hypertensive kidney disease with CKD (chronic kidney disease) stage V (HCC)    Chronic, stable Repeat CMP Followed by nephro  Recommend urine micro       Relevant Orders   Comprehensive Metabolic Panel (CMET)   Urine Microalbumin w/creat. ratio     Other   Absolute anemia    Chronic, in setting of CKD Followed by both nephrology and cardiology Repeat labs Denies bleeding or bruising; endorses coolness and fatigue       Relevant Orders   Lipid panel   Iron, TIBC and Ferritin Panel   Dizziness    Chronic, stable Likely in setting of CHF, edema and bradycardia; continue to recommend  titration of BB with cardiology given previous labile Bps       Relevant Orders   Lipid panel   Iron, TIBC and Ferritin Panel   Elevated glucose    Recommend A1c given hx of elevated glucose Continue to recommend balanced, lower carb meals. Smaller meal size, adding snacks. Choosing water as drink of choice and increasing purposeful exercise.       Relevant Orders   Hemoglobin A1c   Morbid obesity (HCC)    Chronic, stable Body mass index is 39.07 kg/m. Associated with CKD and HTN as well as CHrEF      Return in about 2 months (around 08/13/2022) for chonic disease management.     Leilani Merl, FNP, have reviewed all documentation for this visit. The documentation on 06/13/22 for the exam, diagnosis, procedures, and orders are all accurate and complete.  Jacky Kindle, FNP  [facility] [phone] (phone) [phone] (fax)  [facility]

## 2022-06-13 NOTE — Assessment & Plan Note (Signed)
Chronic, previously under treated Recommend labs given previous TSH of 13.3

## 2022-06-13 NOTE — Assessment & Plan Note (Signed)
Recommend A1c given hx of elevated glucose Continue to recommend balanced, lower carb meals. Smaller meal size, adding snacks. Choosing water as drink of choice and increasing purposeful exercise.

## 2022-06-13 NOTE — Assessment & Plan Note (Signed)
Chronic, stable Body mass index is 39.07 kg/m. Associated with CKD and HTN as well as CHrEF

## 2022-06-13 NOTE — Assessment & Plan Note (Signed)
Chronic, stable Has an upcoming appt with nephrology Reports 64 oz of water/day Repeat CMP and CBC

## 2022-06-13 NOTE — Assessment & Plan Note (Signed)
Chronic, stable Repeat CMP Followed by nephro  Recommend urine micro

## 2022-06-14 ENCOUNTER — Other Ambulatory Visit: Payer: Self-pay | Admitting: Family Medicine

## 2022-06-14 DIAGNOSIS — E039 Hypothyroidism, unspecified: Secondary | ICD-10-CM

## 2022-06-14 DIAGNOSIS — N184 Chronic kidney disease, stage 4 (severe): Secondary | ICD-10-CM

## 2022-06-14 DIAGNOSIS — I10 Essential (primary) hypertension: Secondary | ICD-10-CM

## 2022-06-14 DIAGNOSIS — R432 Parageusia: Secondary | ICD-10-CM

## 2022-06-14 LAB — COMPREHENSIVE METABOLIC PANEL
ALT: 19 IU/L (ref 0–32)
AST: 29 IU/L (ref 0–40)
Albumin/Globulin Ratio: 1.5 (ref 1.2–2.2)
Albumin: 4.1 g/dL (ref 3.7–4.7)
Alkaline Phosphatase: 180 IU/L — ABNORMAL HIGH (ref 44–121)
BUN/Creatinine Ratio: 23 (ref 12–28)
BUN: 65 mg/dL — ABNORMAL HIGH (ref 8–27)
Bilirubin Total: 0.3 mg/dL (ref 0.0–1.2)
CO2: 23 mmol/L (ref 20–29)
Calcium: 10.1 mg/dL (ref 8.7–10.3)
Chloride: 95 mmol/L — ABNORMAL LOW (ref 96–106)
Creatinine, Ser: 2.8 mg/dL — ABNORMAL HIGH (ref 0.57–1.00)
Globulin, Total: 2.8 g/dL (ref 1.5–4.5)
Glucose: 83 mg/dL (ref 70–99)
Potassium: 4.5 mmol/L (ref 3.5–5.2)
Sodium: 134 mmol/L (ref 134–144)
Total Protein: 6.9 g/dL (ref 6.0–8.5)
eGFR: 16 mL/min/{1.73_m2} — ABNORMAL LOW (ref >59)

## 2022-06-14 LAB — LIPID PANEL
Chol/HDL Ratio: 1.6 ratio (ref 0.0–4.4)
Cholesterol, Total: 134 mg/dL (ref 100–199)
HDL: 83 mg/dL (ref >39)
LDL Chol Calc (NIH): 41 mg/dL (ref 0–99)
Triglycerides: 38 mg/dL (ref 0–149)
VLDL Cholesterol Cal: 10 mg/dL (ref 5–40)

## 2022-06-14 LAB — MICROALBUMIN / CREATININE URINE RATIO
Creatinine, Urine: 97.1 mg/dL
Microalb/Creat Ratio: 125 mg/g creat — ABNORMAL HIGH (ref 0–29)
Microalbumin, Urine: 121.5 ug/mL

## 2022-06-14 LAB — CBC WITH DIFFERENTIAL/PLATELET
Basophils Absolute: 0 10*3/uL (ref 0.0–0.2)
Basos: 1 %
EOS (ABSOLUTE): 0 10*3/uL (ref 0.0–0.4)
Eos: 1 %
Hematocrit: 31.6 % — ABNORMAL LOW (ref 34.0–46.6)
Hemoglobin: 10.9 g/dL — ABNORMAL LOW (ref 11.1–15.9)
Immature Grans (Abs): 0 10*3/uL (ref 0.0–0.1)
Immature Granulocytes: 0 %
Lymphocytes Absolute: 1.1 10*3/uL (ref 0.7–3.1)
Lymphs: 28 %
MCH: 32.8 pg (ref 26.6–33.0)
MCHC: 34.5 g/dL (ref 31.5–35.7)
MCV: 95 fL (ref 79–97)
Monocytes Absolute: 0.3 10*3/uL (ref 0.1–0.9)
Monocytes: 8 %
Neutrophils Absolute: 2.5 10*3/uL (ref 1.4–7.0)
Neutrophils: 62 %
Platelets: 197 10*3/uL (ref 150–450)
RBC: 3.32 x10E6/uL — ABNORMAL LOW (ref 3.77–5.28)
RDW: 14.3 % (ref 11.7–15.4)
WBC: 4 10*3/uL (ref 3.4–10.8)

## 2022-06-14 LAB — IRON,TIBC AND FERRITIN PANEL
Ferritin: 352 ng/mL — ABNORMAL HIGH (ref 15–150)
Iron Saturation: 21 % (ref 15–55)
Iron: 65 ug/dL (ref 27–139)
Total Iron Binding Capacity: 304 ug/dL (ref 250–450)
UIBC: 239 ug/dL (ref 118–369)

## 2022-06-14 LAB — TSH+FREE T4
Free T4: 1.22 ng/dL (ref 0.82–1.77)
TSH: 5.15 u[IU]/mL — ABNORMAL HIGH (ref 0.450–4.500)

## 2022-06-14 LAB — HEMOGLOBIN A1C
Est. average glucose Bld gHb Est-mCnc: 105 mg/dL
Hgb A1c MFr Bld: 5.3 % (ref 4.8–5.6)

## 2022-06-14 MED ORDER — LEVOTHYROXINE SODIUM 100 MCG PO TABS: 100.0000 ug | ORAL_TABLET | Freq: Every day | ORAL | 4 refills | Status: DC

## 2022-06-14 MED ORDER — ALLOPURINOL 100 MG PO TABS: 50.0000 mg | ORAL_TABLET | ORAL | 3 refills | Status: DC

## 2022-06-14 MED ORDER — ROSUVASTATIN CALCIUM 10 MG PO TABS: 10.0000 mg | ORAL_TABLET | Freq: Every day | ORAL | 3 refills | Status: DC

## 2022-06-14 NOTE — Progress Notes (Signed)
Stable kidney failure; borderline CKD 4/5 with eGFR at 16. Continue to recommend fluid reduction to prevent further taxing of kidneys. Goal 36 oz -48 oz water per day; can discuss further with nephrology as I believe 64 oz of water may be too much. Suspicion for fatty liver [elevated alk phos] remains as does anemia. However, both are relatively stable. Good news, TSH is stable at 5; and we can continue current dose given stable s/s. LDL at goal; as is A1c [blood sugar average]  Ferritin pending.

## 2022-06-14 NOTE — Progress Notes (Signed)
Improved urine micro

## 2022-06-19 ENCOUNTER — Ambulatory Visit: Payer: Self-pay | Admitting: *Deleted

## 2022-06-19 NOTE — Telephone Encounter (Addendum)
See previous request for medication clarification.  Summary: Pharmacy seeking clinical advice and clarification.   Dawn from [employer] stated the patient is questioning directions for the medication allopurinol (ZYLOPRIM) 100 MG tablet. Sig - Route: Take 0.5 tablets (50 mg total) by mouth 2 (two) times a week. - Oral    Dawn is seeking clinical advice and clarification.          Summary: Questioning why the change in how she takes her medication   allopurinol (ZYLOPRIM) 100 MG tablet, patient has questions regarding the change in how she has to take this medication      No contact with patient at this time due to pharmacy also questioning clarification on medication and dosage. Allopurinol 100 mg - take 0.5 tablets ( 50 mg total) two times a week. Please advise. Last ordered 05/23/22 allopurinol 200 mg - take 2 tablets (200mg  total) by mouth daily.       Reason for Disposition  NON-URGENT call redirected to PCP's office because it is open  Answer Assessment - Initial Assessment Questions N/A No contact with patient regarding medication question due to pharmacy also calling for clarification.  Need to clarify allopurinol 100 mg to take 0.5 tablet ( 50 mg total) 2 x a week.  Protocols used: No Contact or Duplicate Contact Call-A-AH

## 2022-06-20 ENCOUNTER — Telehealth: Payer: Self-pay | Admitting: *Deleted

## 2022-06-20 NOTE — Telephone Encounter (Signed)
   Telephone encounter was:  Unsuccessful.  06/20/2022 Name: Sherry Long MRN: [id number] DOB: 02-21-1940  Unsuccessful outbound call made today to assist with:  Food Insecurity  Outreach Attempt:  3rd Attempt.  Referral closed unable to contact patient.  A HIPAA compliant voice message was left requesting a return call.  Instructed patient to call back at [phone].  Yehuda Mao Greenauer -[facility], Population [facility] [phone] [address] [place] , [place] [address] Email : [email or web address]

## 2022-07-10 DIAGNOSIS — N184 Chronic kidney disease, stage 4 (severe): Secondary | ICD-10-CM | POA: Diagnosis not present

## 2022-07-17 ENCOUNTER — Other Ambulatory Visit: Payer: Self-pay | Admitting: Family Medicine

## 2022-07-17 DIAGNOSIS — I5022 Chronic systolic (congestive) heart failure: Secondary | ICD-10-CM

## 2022-10-12 DIAGNOSIS — Z961 Presence of intraocular lens: Secondary | ICD-10-CM | POA: Diagnosis not present

## 2022-11-17 DIAGNOSIS — H26491 Other secondary cataract, right eye: Secondary | ICD-10-CM | POA: Diagnosis not present

## 2023-01-01 DIAGNOSIS — D631 Anemia in chronic kidney disease: Secondary | ICD-10-CM | POA: Diagnosis not present

## 2023-01-01 DIAGNOSIS — I10 Essential (primary) hypertension: Secondary | ICD-10-CM | POA: Diagnosis not present

## 2023-01-01 DIAGNOSIS — N184 Chronic kidney disease, stage 4 (severe): Secondary | ICD-10-CM | POA: Diagnosis not present

## 2023-01-08 DIAGNOSIS — N185 Chronic kidney disease, stage 5: Secondary | ICD-10-CM | POA: Diagnosis not present

## 2023-01-08 DIAGNOSIS — I10 Essential (primary) hypertension: Secondary | ICD-10-CM | POA: Diagnosis not present

## 2023-01-09 ENCOUNTER — Other Ambulatory Visit: Payer: Self-pay | Admitting: Family Medicine

## 2023-01-09 DIAGNOSIS — I5022 Chronic systolic (congestive) heart failure: Secondary | ICD-10-CM

## 2023-04-10 ENCOUNTER — Ambulatory Visit: Payer: Medicare Other

## 2023-04-10 DIAGNOSIS — Z0000 Encounter for general adult medical examination without abnormal findings: Secondary | ICD-10-CM

## 2023-04-10 NOTE — Patient Instructions (Addendum)
 Ms. Poellnitz , Thank you for taking time to come for your Medicare Wellness Visit. I appreciate your ongoing commitment to your health goals. Please review the following plan we discussed and let me know if I can assist you in the future.   Referrals/Orders/Follow-Ups/Clinician Recommendations: NONE  This is a list of the screening recommended for you and due dates:  Health Maintenance  Topic Date Due   COVID-19 Vaccine (1) Never done   Zoster (Shingles) Vaccine (1 of 2) Never done   DTaP/Tdap/Td vaccine (2 - Tdap) 08/08/2008   Flu Shot  Never done   Medicare Annual Wellness Visit  04/09/2024   Pneumonia Vaccine  Completed   HPV Vaccine  Aged Out   DEXA scan (bone density measurement)  Discontinued   Hepatitis C Screening  Discontinued    Advanced directives: (ACP Link)Information on Advanced Care Planning can be found at [facility] [place] Advance Health Care Directives Advance Health Care Directives ([email or web address]   Next Medicare Annual Wellness Visit scheduled for next year: Yes   04/22/24 @ 3:10 PM BY PHONE

## 2023-04-10 NOTE — Progress Notes (Signed)
 Subjective:   Sherry Long is a 83 y.o. who presents for a Medicare Wellness preventive visit.  Visit Complete: Virtual I connected with  [name] on 04/10/23 by a audio enabled telemedicine application and verified that I am speaking with the correct person using two identifiers.  Patient Location: Home  Provider Location: Office/Clinic  I discussed the limitations of evaluation and management by telemedicine. The patient expressed understanding and agreed to proceed.  Vital Signs: Because this visit was a virtual/telehealth visit, some criteria may be missing or patient reported. Any vitals not documented were not able to be obtained and vitals that have been documented are patient reported.  VideoDeclined- This patient declined Librarian, academic. Therefore the visit was completed with audio only.  AWV Questionnaire: No: Patient Medicare AWV questionnaire was not completed prior to this visit.  Cardiac Risk Factors include: advanced age (>42men, >41 women);hypertension;obesity (BMI >30kg/m2);sedentary lifestyle     Objective:    There were no vitals filed for this visit. There is no height or weight on file to calculate BMI.     04/10/2023    3:17 PM 04/04/2022    3:34 PM 03/29/2021    3:47 PM 12/30/2019   10:12 AM 12/09/2019    8:24 AM 07/09/2019   10:13 AM 07/08/2018   10:09 AM  Advanced Directives  Does Patient Have a Medical Advance Directive? No Yes No Yes Yes Yes Yes  Type of Furniture conservator/restorer;Living will  Healthcare Power of [name];Living will Healthcare Power of [name];Living will Healthcare Power of [name];Living will Healthcare Power of [name];Living will  Does patient want to make changes to medical advance directive?    No - Patient declined No - Patient declined    Copy of Healthcare Power of Attorney in Chart?  No - copy requested  Yes - validated most recent copy scanned in chart (See row  information) No - copy requested Yes - validated most recent copy scanned in chart (See row information) Yes - validated most recent copy scanned in chart (See row information)  Would patient like information on creating a medical advance directive? No - Patient declined  No - Patient declined        Current Medications (verified) Outpatient Encounter Medications as of 04/10/2023  Medication Sig   allopurinol (ZYLOPRIM) 100 MG tablet Take 0.5 tablets (50 mg total) by mouth 2 (two) times a week.   furosemide (LASIX) 40 MG tablet TAKE 1 TABLET BY MOUTH DAILY   levothyroxine (SYNTHROID) 100 MCG tablet Take 1 tablet (100 mcg total) by mouth daily.   Multiple Vitamins-Iron (MULTIVITAMIN PLUS IRON ADULT PO) Take by mouth daily.   rosuvastatin (CRESTOR) 10 MG tablet Take 1 tablet (10 mg total) by mouth daily.   metoprolol tartrate (LOPRESSOR) 25 MG tablet TAKE 1 TABLET BY MOUTH TWICE A DAY   No facility-administered encounter medications on file as of 04/10/2023.    Allergies (verified) Aspirin and Hydrochlorothiazide   History: Past Medical History:  Diagnosis Date   Arthritis    lumbar spine   Chronic kidney disease (CKD), stage IV (severe) (HCC)    Gout    Hypertension    Hypothyroidism    Morbid obesity (HCC)    Sciatica    right   Wears dentures    full upper   Past Surgical History:  Procedure Laterality Date   CATARACT EXTRACTION W/PHACO Right 12/09/2019   Procedure: CATARACT EXTRACTION PHACO AND INTRAOCULAR LENS  PLACEMENT (IOC) RIGHT 4.79 00:37.7;  Surgeon: Galen Manila, MD;  Location[facility];  Service: Ophthalmology;  Laterality: Right;   CATARACT EXTRACTION W/PHACO Left 12/30/2019   Procedure: CATARACT EXTRACTION PHACO AND INTRAOCULAR LENS PLACEMENT (IOC) LEFT 5.75 00:35.6 ;  Surgeon: Galen Manila, MD;  Location: [facility];  Service: Ophthalmology;  Laterality: Left;   COLONOSCOPY     TONSILLECTOMY AND ADENOIDECTOMY  1948   Family History   Problem Relation Age of Onset   Heart disease Mother    Alzheimer's disease Mother    Stroke Father    Hypertension Sister    Heart disease Sister    Heart disease Maternal Grandmother    Stroke Maternal Grandmother    Colon cancer Other    Social History   Socioeconomic History   Marital status: Widowed    Spouse name: Not on file   Number of children: 1   Years of education: Not on file   Highest education level: Associate degree: occupational, Scientist, product/process development, or vocational program  Occupational History   Occupation: retired  Tobacco Use   Smoking status: Former    Current packs/day: 0.00    Types: Cigarettes    Quit date: 02/06/1976    Years since quitting: 47.2   Smokeless tobacco: Never   Tobacco comments:    quit in 20's  Vaping Use   Vaping status: Never Used  Substance and Sexual Activity   Alcohol use: No   Drug use: No   Sexual activity: Not on file  Other Topics Concern   Not on file  Social History Narrative   1 son - deceased   Social Drivers of Corporate investment banker Strain: Low Risk  (04/10/2023)   Overall Financial Resource Strain (CARDIA)    Difficulty of Paying Living Expenses: Not hard at all  Food Insecurity: No Food Insecurity (04/10/2023)   Hunger Vital Sign    Worried About Running Out of Food in the Last Year: Never true    Ran Out of Food in the Last Year: Never true  Transportation Needs: No Transportation Needs (04/10/2023)   PRAPARE - Administrator, Civil Service (Medical): No    Lack of Transportation (Non-Medical): No  Physical Activity: Insufficiently Active (04/10/2023)   Exercise Vital Sign    Days of Exercise per Week: 3 days    Minutes of Exercise per Session: 30 min  Stress: No Stress Concern Present (04/10/2023)   [employer] of Occupational Health - Occupational Stress Questionnaire    Feeling of Stress : Not at all  Social Connections: Socially Isolated (04/10/2023)   Social Connection and Isolation Panel  [NHANES]    Frequency of Communication with Friends and Family: More than three times a week    Frequency of Social Gatherings with Friends and Family: Never    Attends Religious Services: Never    Database administrator or Organizations: No    Attends Banker Meetings: Never    Marital Status: Widowed    Tobacco Counseling Counseling given: Not Answered Tobacco comments: quit in 20's    Clinical Intake:  Pre-visit preparation completed: Yes  Pain : No/denies pain     BMI - recorded: 38.9 Nutritional Status: BMI > 30  Obese Nutritional Risks: None Diabetes: No  How often do you need to have someone help you when you read instructions, pamphlets, or other written materials from your doctor or pharmacy?: 1 - Never  Interpreter Needed?: No  Information entered  by :: Kennedy Bucker, LPN   Activities of Daily Living     04/10/2023    3:20 PM 06/13/2022    2:16 PM  In your present state of health, do you have any difficulty performing the following activities:  Hearing? 0 0  Vision? 0 0  Difficulty concentrating or making decisions? 0 0  Walking or climbing stairs? 0 0  Dressing or bathing? 0 1  Doing errands, shopping? 0 1  Preparing Food and eating ? N   Using the Toilet? N   In the past six months, have you accidently leaked urine? Y   Do you have problems with loss of bowel control? N   Managing your Medications? N   Managing your Finances? N   Housekeeping or managing your Housekeeping? N     Patient Care Team: Debera Lat, PA-C as PCP - General (Physician Assistant) Clelia Croft, DO as Consulting Physician (Nephrology) Galen Manila, MD as Referring Physician (Ophthalmology)  Indicate any recent Medical Services you may have received from other than [facility] providers in the past year (date may be approximate).     Assessment:   This is a routine wellness examination for Kaydince.  Hearing/Vision screen Hearing Screening - Comments:: NO  AIDS Vision Screening - Comments:: WEARS GLASSES ALL THE TIME, HAD CATARACT SGY- DR.PORFILIO   Goals Addressed             This Visit's Progress    DIET - INCREASE WATER INTAKE         Depression Screen     04/10/2023    3:14 PM 06/13/2022    2:14 PM 04/04/2022    3:29 PM 08/02/2021    4:04 PM 03/29/2021    3:44 PM 07/09/2019   10:08 AM 07/08/2018   10:09 AM  PHQ 2/9 Scores  PHQ - 2 Score 0 0 0 0 0 0 0  PHQ- 9 Score 0 5  4       Fall Risk     04/10/2023    3:19 PM 06/13/2022    2:14 PM 04/04/2022    3:22 PM 08/02/2021    4:04 PM 03/29/2021    3:48 PM  Fall Risk   Falls in the past year? 0 0 0 0 0  Number falls in past yr: 0 0 0 0 0  Injury with Fall? 0 0 0 0 0  Risk for fall due to : No Fall Risks No Fall Risks No Fall Risks  No Fall Risks  Follow up Falls prevention discussed;Falls evaluation completed Falls evaluation completed Falls prevention discussed;Education provided  Falls evaluation completed    MEDICARE RISK AT HOME:  Medicare Risk at Home Any stairs in or around the home?: Yes If so, are there any without handrails?: No Home free of loose throw rugs in walkways, pet beds, electrical cords, etc?: Yes Adequate lighting in your home to reduce risk of falls?: Yes Life alert?: Yes Use of a cane, walker or w/c?: Yes (CANE IN HOUSE; WALKER OTHERWISE) Grab bars in the bathroom?: Yes Shower chair or bench in shower?: Yes Elevated toilet seat or a handicapped toilet?: Yes  TIMED UP AND GO:  Was the test performed?  No  Cognitive Function: 6CIT completed        04/10/2023    3:23 PM 04/04/2022    3:40 PM 07/09/2019   10:18 AM 07/08/2018   10:18 AM 06/29/2016    2:03 PM  6CIT Screen  What Year? 0 points 0 points  0 points 0 points 0 points  What month? 0 points 0 points 0 points 0 points 0 points  What time? 0 points 0 points 0 points 0 points 0 points  Count back from 20 0 points 0 points 0 points 0 points 0 points  Months in reverse 0 points 0 points 0 points 0  points 0 points  Repeat phrase 0 points 0 points 0 points 0 points 0 points  Total Score 0 points 0 points 0 points 0 points 0 points    Immunizations Immunization History  Administered Date(s) Administered   Pneumococcal Conjugate-13 06/22/2014   Pneumococcal Polysaccharide-23 06/23/2015   Td 08/09/1998    Screening Tests Health Maintenance  Topic Date Due   COVID-19 Vaccine (1) Never done   Zoster Vaccines- Shingrix (1 of 2) Never done   DTaP/Tdap/Td (2 - Tdap) 08/08/2008   INFLUENZA VACCINE  Never done   Medicare Annual Wellness (AWV)  04/09/2024   Pneumonia Vaccine 43+ Years old  Completed   HPV VACCINES  Aged Out   DEXA SCAN  Discontinued   Hepatitis C Screening  Discontinued    Health Maintenance  Health Maintenance Due  Topic Date Due   COVID-19 Vaccine (1) Never done   Zoster Vaccines- Shingrix (1 of 2) Never done   DTaP/Tdap/Td (2 - Tdap) 08/08/2008   INFLUENZA VACCINE  Never done   Health Maintenance Items Addressed: DOESN'T TAKE FLU SHOTS  Additional Screening:  Vision Screening: Recommended annual ophthalmology exams for early detection of glaucoma and other disorders of the eye.  Dental Screening: Recommended annual dental exams for proper oral hygiene  Community Resource Referral / Chronic Care Management: CRR required this visit?  No   CCM required this visit?  No     Plan:     I have personally reviewed and noted the following in the patient's chart:   Medical and social history Use of alcohol, tobacco or illicit drugs  Current medications and supplements including opioid prescriptions. Patient is not currently taking opioid prescriptions. Functional ability and status Nutritional status Physical activity Advanced directives List of other physicians Hospitalizations, surgeries, and ER visits in previous 12 months Vitals Screenings to include cognitive, depression, and falls Referrals and appointments  In addition, I have reviewed  and discussed with patient certain preventive protocols, quality metrics, and best practice recommendations. A written personalized care plan for preventive services as well as general preventive health recommendations were provided to patient.     Hal Hope, LPN   05/13/9627   After Visit Summary: (MyChart) Due to this being a telephonic visit, the after visit summary with patients personalized plan was offered to patient via MyChart   Notes: NONE

## 2023-06-25 ENCOUNTER — Telehealth: Payer: Self-pay | Admitting: Physician Assistant

## 2023-06-25 NOTE — Telephone Encounter (Signed)
[facility] faxed refill request for the following medications:   rosuvastatin (CRESTOR) 10 MG tablet     Please advise.

## 2023-06-26 ENCOUNTER — Other Ambulatory Visit: Payer: Self-pay

## 2023-06-26 DIAGNOSIS — I10 Essential (primary) hypertension: Secondary | ICD-10-CM

## 2023-06-26 DIAGNOSIS — N184 Chronic kidney disease, stage 4 (severe): Secondary | ICD-10-CM

## 2023-06-26 MED ORDER — ROSUVASTATIN CALCIUM 10 MG PO TABS
10.0000 mg | ORAL_TABLET | Freq: Every day | ORAL | 0 refills | Status: DC
Start: 2023-06-26 — End: 2023-08-30

## 2023-06-26 NOTE — Telephone Encounter (Signed)
Converted to rf request

## 2023-07-06 ENCOUNTER — Other Ambulatory Visit: Payer: Self-pay | Admitting: Physician Assistant

## 2023-07-06 DIAGNOSIS — I5022 Chronic systolic (congestive) heart failure: Secondary | ICD-10-CM

## 2023-07-06 NOTE — Telephone Encounter (Signed)
 [facility] faxed refill request for the following medications:   furosemide  (LASIX ) 40 MG tablet     Please advise.

## 2023-07-10 ENCOUNTER — Other Ambulatory Visit: Payer: Self-pay | Admitting: Physician Assistant

## 2023-07-10 DIAGNOSIS — I5022 Chronic systolic (congestive) heart failure: Secondary | ICD-10-CM

## 2023-07-10 NOTE — Telephone Encounter (Unsigned)
 Copied from CRM [id number]. Topic: Clinical - Medication Refill >> Jul 10, 2023  2:06 PM Essie A wrote: Medication: furosemide  (LASIX ) 40 MG tablet***  Has the patient contacted their pharmacy? Yes (Agent: If no, request that the patient contact the pharmacy for the refill. If patient does not wish to contact the pharmacy document the reason why and proceed with request.) (Agent: If yes, when and what did the pharmacy advise?)  This is the patient's preferred pharmacy:  [facility] - [place], [place] - [address] [place] [address] Phone: [phone] Fax: [phone]  Is this the correct pharmacy for this prescription? Yes If no, delete pharmacy and type the correct one.   Has the prescription been filled recently? Yes  Is the patient out of the medication? No  Has the patient been seen for an appointment in the last year OR does the patient have an upcoming appointment? Yes  Can we respond through MyChart? No  Agent: Please be advised that Rx refills may take up to 3 business days. We ask that you follow-up with your pharmacy.

## 2023-07-11 MED ORDER — FUROSEMIDE 40 MG PO TABS: 40.0000 mg | ORAL_TABLET | Freq: Every day | ORAL | 0 refills | Status: DC

## 2023-07-11 NOTE — Telephone Encounter (Signed)
 A courtesy refill. Pt needs to see a new provider as Iona Manis left the office

## 2023-07-11 NOTE — Telephone Encounter (Signed)
 Duplicate request,refilled 07/11/23.  Requested Prescriptions  Pending Prescriptions Disp Refills   furosemide  (LASIX ) 40 MG tablet 90 tablet 1    Sig: Take 1 tablet (40 mg total) by mouth daily.     Cardiovascular:  Diuretics - Loop Failed - 07/11/2023  2:40 PM      Failed - K in normal range and within 180 days    Potassium  Date Value Ref Range Status  06/13/2022 4.5 3.5 - 5.2 mmol/L Final         Failed - Ca in normal range and within 180 days    Calcium   Date Value Ref Range Status  06/13/2022 10.1 8.7 - 10.3 mg/dL Final         Failed - Na in normal range and within 180 days    Sodium  Date Value Ref Range Status  06/13/2022 134 134 - 144 mmol/L Final         Failed - Cr in normal range and within 180 days    Creatinine, Ser  Date Value Ref Range Status  06/13/2022 2.80 (H) 0.57 - 1.00 mg/dL Final         Failed - Cl in normal range and within 180 days    Chloride  Date Value Ref Range Status  06/13/2022 95 (L) 96 - 106 mmol/L Final         Failed - Mg Level in normal range and within 180 days    No results found for: "MG"       Passed - Last BP in normal range    BP Readings from Last 1 Encounters:  06/13/22 134/84         Passed - Valid encounter within last 6 months    Recent Outpatient Visits   None

## 2023-08-29 ENCOUNTER — Other Ambulatory Visit: Payer: Self-pay | Admitting: Physician Assistant

## 2023-08-29 DIAGNOSIS — I10 Essential (primary) hypertension: Secondary | ICD-10-CM

## 2023-08-29 DIAGNOSIS — N184 Chronic kidney disease, stage 4 (severe): Secondary | ICD-10-CM

## 2023-08-30 ENCOUNTER — Other Ambulatory Visit: Payer: Self-pay

## 2023-08-30 DIAGNOSIS — N184 Chronic kidney disease, stage 4 (severe): Secondary | ICD-10-CM

## 2023-08-30 DIAGNOSIS — I10 Essential (primary) hypertension: Secondary | ICD-10-CM

## 2023-08-30 MED ORDER — ROSUVASTATIN CALCIUM 10 MG PO TABS: 10.0000 mg | ORAL_TABLET | Freq: Every day | ORAL | 0 refills | Status: DC

## 2023-09-10 ENCOUNTER — Other Ambulatory Visit: Payer: Self-pay | Admitting: Physician Assistant

## 2023-09-10 DIAGNOSIS — E039 Hypothyroidism, unspecified: Secondary | ICD-10-CM

## 2023-09-17 ENCOUNTER — Other Ambulatory Visit: Payer: Self-pay

## 2023-09-17 ENCOUNTER — Telehealth: Payer: Self-pay | Admitting: Physician Assistant

## 2023-09-17 NOTE — Telephone Encounter (Signed)
 [facility] faxed refill request for the following medications:   allopurinol  (ZYLOPRIM ) 100 MG tablet     Please advise.

## 2023-09-18 MED ORDER — ALLOPURINOL 100 MG PO TABS: 50.0000 mg | ORAL_TABLET | ORAL | 0 refills | Status: DC

## 2023-10-09 ENCOUNTER — Encounter: Payer: Self-pay | Admitting: Physician Assistant

## 2023-10-09 ENCOUNTER — Ambulatory Visit (INDEPENDENT_AMBULATORY_CARE_PROVIDER_SITE_OTHER): Admitting: Physician Assistant

## 2023-10-09 VITALS — BP 186/90 | HR 54 | Temp 97.3°F | Ht 65.0 in | Wt 214.6 lb

## 2023-10-09 DIAGNOSIS — Z5982 Transportation insecurity: Secondary | ICD-10-CM | POA: Insufficient documentation

## 2023-10-09 DIAGNOSIS — E039 Hypothyroidism, unspecified: Secondary | ICD-10-CM

## 2023-10-09 DIAGNOSIS — D508 Other iron deficiency anemias: Secondary | ICD-10-CM

## 2023-10-09 DIAGNOSIS — R7309 Other abnormal glucose: Secondary | ICD-10-CM | POA: Diagnosis not present

## 2023-10-09 DIAGNOSIS — I5022 Chronic systolic (congestive) heart failure: Secondary | ICD-10-CM

## 2023-10-09 DIAGNOSIS — I10 Essential (primary) hypertension: Secondary | ICD-10-CM | POA: Insufficient documentation

## 2023-10-09 DIAGNOSIS — N184 Chronic kidney disease, stage 4 (severe): Secondary | ICD-10-CM | POA: Insufficient documentation

## 2023-10-09 NOTE — Progress Notes (Signed)
 Established patient visit  Patient: Sherry Long   DOB: 07/21/1940   83 y.o. Female  MRN: [id number] Visit Date: 10/09/2023  Today's healthcare provider: Jolynn Spencer, PA-C   Chief Complaint  Patient presents with   Medical Management of Chronic Issues    Patient presents for follow up of chronic issues. Reports feeling well overall. States she has some issues with appetite, state she does not enjoy food as she used to. Has no problems with eating because she knows she needs to, thinks it may have to do with her age.    Hypertension    Patient had 2 high blood pressure readings today, states she has not taken any BP meds today.    Subjective     HPI     Medical Management of Chronic Issues    Additional comments: Patient presents for follow up of chronic issues. Reports feeling well overall. States she has some issues with appetite, state she does not enjoy food as she used to. Has no problems with eating because she knows she needs to, thinks it may have to do with her age.         Hypertension    Additional comments: Patient had 2 high blood pressure readings today, states she has not taken any BP meds today.       Last edited by Cherry Chiquita HERO, CMA on 10/09/2023  1:22 PM.       Discussed the use of AI scribe software for clinical note transcription with the patient, who gave verbal consent to proceed.  History of Present Illness Sherry Long is an 83 year old female with hypertension and heart failure who presents with elevated blood pressure.  She takes metoprolol  25 mg for blood pressure management but did not take it on the morning of the visit. She has experienced hypotension in the past, leading to temporary discontinuation of the medication and home monitoring of blood pressure. Her blood pressure readings fluctuate, which she attributes to stress and changes in her routine, such as using public medical transportation since she no longer drives.  She is on a  regular regimen of Lasix . She sees a nephrologist every six months for kidney function monitoring but has not seen a cardiologist in about a year. She experiences swelling, which she considers normal for her condition, and uses a stool to elevate her legs. She uses compression stockings when standing and elevates her legs when sitting or lying down.  She eats twice a day and acknowledges the need to exercise more. She is sometimes confused about how much water to drink, as she has received differing advice from her doctors regarding her kidney and heart conditions. No chest pain, shortness of breath, abdominal pain, or bowel movement problems.       10/09/2023    1:15 PM 04/10/2023    3:14 PM 06/13/2022    2:14 PM  Depression screen PHQ 2/9  Decreased Interest 0 0 0  Down, Depressed, Hopeless 0 0 0  PHQ - 2 Score 0 0 0  Altered sleeping 1 0 1  Tired, decreased energy 1 0 3  Change in appetite 0 0 1  Feeling bad or failure about yourself  0 0 0  Trouble concentrating 0 0 0  Moving slowly or fidgety/restless 0 0 0  Suicidal thoughts 0 0 0  PHQ-9 Score 2 0 5  Difficult doing work/chores Not difficult at all Not difficult at all Not difficult at all  10/09/2023    1:15 PM  GAD 7 : Generalized Anxiety Score  Nervous, Anxious, on Edge 0  Control/stop worrying 0  Worry too much - different things 0  Trouble relaxing 0  Restless 0  Easily annoyed or irritable 0  Afraid - awful might happen 0  Total GAD 7 Score 0  Anxiety Difficulty Not difficult at all    Medications: Outpatient Medications Prior to Visit  Medication Sig   allopurinol  (ZYLOPRIM ) 100 MG tablet Take 0.5 tablets (50 mg total) by mouth 2 (two) times a week.   furosemide  (LASIX ) 40 MG tablet Take 1 tablet (40 mg total) by mouth daily.   levothyroxine  (SYNTHROID ) 100 MCG tablet TAKE 1 TABLET BY MOUTH DAILY   metoprolol  tartrate (LOPRESSOR ) 25 MG tablet TAKE 1 TABLET BY MOUTH TWICE A DAY   Multiple Vitamins-Iron  (MULTIVITAMIN PLUS IRON ADULT PO) Take by mouth daily.   rosuvastatin  (CRESTOR ) 10 MG tablet Take 1 tablet (10 mg total) by mouth daily.   No facility-administered medications prior to visit.    Review of Systems  All other systems reviewed and are negative.  All negative Except see HPI       Objective    BP (!) 186/90 (BP Location: Left Arm, Patient Position: Sitting, Cuff Size: Normal)   Pulse (!) 54   Temp (!) 97.3 F (36.3 C) (Oral)   Ht 5' 5 (1.651 m)   Wt 214 lb 9.6 oz (97.3 kg)   SpO2 100%   BMI 35.71 kg/m     Physical Exam Vitals reviewed.  Constitutional:      General: She is not in acute distress.    Appearance: Normal appearance. She is well-developed. She is obese. She is not diaphoretic.  HENT:     Head: Normocephalic and atraumatic.  Eyes:     General: No scleral icterus.    Conjunctiva/sclera: Conjunctivae normal.  Neck:     Thyroid : No thyromegaly.  Cardiovascular:     Rate and Rhythm: Normal rate and regular rhythm.     Pulses: Normal pulses.     Heart sounds: Normal heart sounds. No murmur heard. Pulmonary:     Effort: Pulmonary effort is normal. No respiratory distress.     Breath sounds: Normal breath sounds. No wheezing, rhonchi or rales.  Musculoskeletal:     Cervical back: Neck supple.     Right lower leg: No edema.     Left lower leg: No edema.  Lymphadenopathy:     Cervical: No cervical adenopathy.  Skin:    General: Skin is warm and dry.     Findings: No rash.  Neurological:     Mental Status: She is alert and oriented to person, place, and time. Mental status is at baseline.  Psychiatric:        Mood and Affect: Mood normal.        Behavior: Behavior normal.      No results found for any visits on 10/09/23.     Transition of care from [facility] Assessment & Plan Essential hypertension Chronic and unstable Blood pressure management is suboptimal with episodes of hypertension and hypotension. Current medication is  metoprolol  25 mg BID, but adherence is inconsistent. Today's elevated blood pressure may be due to missed medication and stress. - Order blood work to guide medication adjustments. - Instruct to measure and record blood pressure at home and bring records to next appointment. - Instruct to bring blood pressure cuff and medications to next appointment. -  Schedule follow-up appointment in six weeks for reassessment. Will follow-up  Chronic systolic heart failure Heart failure management is ongoing without recent cardiology follow-up, last seen over a year ago. Importance of regular cardiology visits and fluid intake monitoring discussed. - Encourage regular follow-up with cardiologist, at least annually. - Advise on balanced fluid intake to prevent fluid overload and dehydration.  Chronic kidney disease, stage 4 Kidney function is monitored by a nephrologist with follow-ups every six months. - Order blood work to assess kidney function. - Continue regular nephrology follow-ups every six months.  Hypothyroidism Thyroid  function will be assessed with upcoming blood work to determine if medication adjustments are necessary. - Order blood work to assess thyroid  function. Will follow-up   Acquired hypothyroidism (Primary)  - Comprehensive metabolic panel with GFR - CBC with Differential/Platelet - Hemoglobin A1c - Lipid panel - TSH - T4, free  Essential hypertension  - Comprehensive metabolic panel with GFR - CBC with Differential/Platelet - Hemoglobin A1c - Lipid panel - TSH - T4, free  Morbid obesity (HCC) Chronic and stable Body mass index is 35.71 kg/m.  Weight loss of 5% of pt's current weight via healthy diet and daily exercise encouraged. ordered - Comprehensive metabolic panel with GFR - CBC with Differential/Platelet - Hemoglobin A1c - Lipid panel - TSH - T4, free Will reassess after  receiving lab results Will FU  Elevated glucose In the past, A1c  ordered Will follow-up  Chronic kidney disease, stage IV (severe) (HCC)  - Comprehensive metabolic panel with GFR - CBC with Differential/Platelet - Hemoglobin A1c - Lipid panel - TSH - T4, free  Other iron deficiency anemia In the past, will recheck - CBC with Differential/Platelet Will follow-up  Lack of access to transportation Cannot drive Causes a stress  Chronic HFrEF (heart failure with reduced ejection fraction) (HCC)  - Comprehensive metabolic panel with GFR - CBC with Differential/Platelet - Hemoglobin A1c - Lipid panel - TSH - T4, free  Orders Placed This Encounter  Procedures   Comprehensive metabolic panel with GFR    Has the patient fasted?:   Yes   CBC with Differential/Platelet   Hemoglobin A1c   Lipid panel    Has the patient fasted?:   Yes   TSH   T4, free    No follow-ups on file.   The patient was advised to call back or seek an in-person evaluation if the symptoms worsen or if the condition fails to improve as anticipated.  I discussed the assessment and treatment plan with the patient. The patient was provided an opportunity to ask questions and all were answered. The patient agreed with the plan and demonstrated an understanding of the instructions.  I, Yalanda Soderman, PA-C have reviewed all documentation for this visit. The documentation on 10/09/2023  for the exam, diagnosis, procedures, and orders are all accurate and complete.  Jolynn Spencer, [facility], MMS [facility] [phone] (phone) [phone] (fax)  [facility]

## 2023-10-10 ENCOUNTER — Ambulatory Visit: Payer: Self-pay

## 2023-10-10 ENCOUNTER — Ambulatory Visit: Payer: Self-pay | Admitting: Physician Assistant

## 2023-10-10 LAB — CBC WITH DIFFERENTIAL/PLATELET
Basophils Absolute: 0 x10E3/uL (ref 0.0–0.2)
Basos: 0 %
EOS (ABSOLUTE): 0 x10E3/uL (ref 0.0–0.4)
Eos: 1 %
Hematocrit: 34.1 % (ref 34.0–46.6)
Hemoglobin: 11.6 g/dL (ref 11.1–15.9)
Immature Grans (Abs): 0 x10E3/uL (ref 0.0–0.1)
Immature Granulocytes: 0 %
Lymphocytes Absolute: 1.6 x10E3/uL (ref 0.7–3.1)
Lymphs: 34 %
MCH: 33.1 pg — ABNORMAL HIGH (ref 26.6–33.0)
MCHC: 34 g/dL (ref 31.5–35.7)
MCV: 97 fL (ref 79–97)
Monocytes Absolute: 0.4 x10E3/uL (ref 0.1–0.9)
Monocytes: 9 %
Neutrophils Absolute: 2.5 x10E3/uL (ref 1.4–7.0)
Neutrophils: 56 %
Platelets: 198 x10E3/uL (ref 150–450)
RBC: 3.5 x10E6/uL — ABNORMAL LOW (ref 3.77–5.28)
RDW: 13.1 % (ref 11.7–15.4)
WBC: 4.5 x10E3/uL (ref 3.4–10.8)

## 2023-10-10 LAB — COMPREHENSIVE METABOLIC PANEL WITH GFR
ALT: 13 IU/L (ref 0–32)
AST: 23 IU/L (ref 0–40)
Albumin: 4.6 g/dL (ref 3.7–4.7)
Alkaline Phosphatase: 123 IU/L — ABNORMAL HIGH (ref 44–121)
BUN/Creatinine Ratio: 19 (ref 12–28)
BUN: 57 mg/dL — ABNORMAL HIGH (ref 8–27)
Bilirubin Total: 0.3 mg/dL (ref 0.0–1.2)
CO2: 20 mmol/L (ref 20–29)
Calcium: 10.3 mg/dL (ref 8.7–10.3)
Chloride: 100 mmol/L (ref 96–106)
Creatinine, Ser: 3.07 mg/dL — ABNORMAL HIGH (ref 0.57–1.00)
Globulin, Total: 2.5 g/dL (ref 1.5–4.5)
Glucose: 98 mg/dL (ref 70–99)
Potassium: 4 mmol/L (ref 3.5–5.2)
Sodium: 140 mmol/L (ref 134–144)
Total Protein: 7.1 g/dL (ref 6.0–8.5)
eGFR: 15 mL/min/1.73 — ABNORMAL LOW (ref >59)

## 2023-10-10 LAB — LIPID PANEL
Chol/HDL Ratio: 1.8 ratio (ref 0.0–4.4)
Cholesterol, Total: 136 mg/dL (ref 100–199)
HDL: 77 mg/dL (ref >39)
LDL Chol Calc (NIH): 40 mg/dL (ref 0–99)
Triglycerides: 109 mg/dL (ref 0–149)
VLDL Cholesterol Cal: 19 mg/dL (ref 5–40)

## 2023-10-10 LAB — HEMOGLOBIN A1C
Est. average glucose Bld gHb Est-mCnc: 105 mg/dL
Hgb A1c MFr Bld: 5.3 % (ref 4.8–5.6)

## 2023-10-10 LAB — TSH: TSH: 6.45 u[IU]/mL — ABNORMAL HIGH (ref 0.450–4.500)

## 2023-10-10 LAB — T4, FREE: Free T4: 1.28 ng/dL (ref 0.82–1.77)

## 2023-10-10 NOTE — Telephone Encounter (Signed)
 FYI Only or Action Required?: FYI only for provider. - Please call pt if other/further info  Patient was last seen in primary care on 06/13/2022 by Emilio Kelly DASEN, FNP.  Called Nurse Triage reporting results question.  Triage Disposition: Information or Advice Only Call  Patient/caregiver understands and will follow disposition?: Yes      Copied from CRM [id number]. Topic: Clinical - Lab/Test Results >> Oct 10, 2023  1:40 PM Carla L wrote: Reason for CRM: Relayed results to patient, has additional questions. Reason for Disposition  Health information question, no triage required and triager able to answer question  Answer Assessment - Initial Assessment Questions 1. REASON FOR CALL: What is the main reason for your call? or How can I best help you?     Results GFR dropped - asking what that means TSH dropped - asking what that means  3. OTHER QUESTIONS: Do you have any other questions?     No further questions or concerns   Advised that GFR was marked as low, but doc would better be able to interpret where pt stands with that value taking her hx and medical conditions into consideration Pt confirms that she's been maintaining GFR 15-16 for past few years and that nephrology thought encouraging since maintaining  Advised pt that TSH is thyroid -stimulating hormone and doc just wants to monitor it at next follow up  Advised pt go with nephrologist interpretation of labs and follow closely with PCP.  Protocols used: Information Only Call - No Triage-A-AH

## 2023-10-11 ENCOUNTER — Ambulatory Visit: Payer: Self-pay

## 2023-10-11 NOTE — Telephone Encounter (Signed)
 Pt states that she was returning VM, this RN answered all pt questions.

## 2023-10-12 ENCOUNTER — Other Ambulatory Visit: Payer: Self-pay | Admitting: Physician Assistant

## 2023-10-12 DIAGNOSIS — I5022 Chronic systolic (congestive) heart failure: Secondary | ICD-10-CM

## 2023-10-12 NOTE — Telephone Encounter (Unsigned)
 Copied from CRM [id number]. Topic: Clinical - Lab/Test Results >> Oct 12, 2023 12:11 PM Selinda RAMAN wrote: Reason for CRM: The patient returned Kohen Reither's call regarding her lab results. She did say she already spoke with someone about her results but if there is something she is missing or any additional comments and things that need to be relayed she will be available. Please assist patient further.

## 2023-10-29 ENCOUNTER — Other Ambulatory Visit: Payer: Self-pay | Admitting: Physician Assistant

## 2023-10-29 DIAGNOSIS — I10 Essential (primary) hypertension: Secondary | ICD-10-CM

## 2023-10-29 DIAGNOSIS — N184 Chronic kidney disease, stage 4 (severe): Secondary | ICD-10-CM

## 2023-10-30 ENCOUNTER — Telehealth: Payer: Self-pay | Admitting: Physician Assistant

## 2023-10-30 ENCOUNTER — Other Ambulatory Visit: Payer: Self-pay

## 2023-10-30 NOTE — Telephone Encounter (Signed)
 [facility] faxed refill request for the following medications:  metoprolol  tartrate (LOPRESSOR ) 25 MG tablet    Please advise.

## 2023-10-31 MED ORDER — METOPROLOL TARTRATE 25 MG PO TABS: 25.0000 mg | ORAL_TABLET | Freq: Two times a day (BID) | ORAL | 3 refills | Status: AC

## 2023-12-10 ENCOUNTER — Other Ambulatory Visit: Payer: Self-pay | Admitting: Physician Assistant

## 2023-12-10 DIAGNOSIS — E039 Hypothyroidism, unspecified: Secondary | ICD-10-CM

## 2023-12-10 NOTE — Telephone Encounter (Signed)
 LOV 10/09/23 NOV 04/08/24 LRF 09/20/23 qty:[id number] r:0

## 2023-12-13 ENCOUNTER — Telehealth: Payer: Self-pay

## 2023-12-13 ENCOUNTER — Other Ambulatory Visit: Payer: Self-pay

## 2023-12-13 NOTE — Telephone Encounter (Signed)
 Copied from CRM #[id number]. Topic: Clinical - Medication Question >> Dec 13, 2023  1:38 PM Olam RAMAN wrote: Reason for CRM: [facility] about Pending Allopurinol  100 MG TAKE 1/2 TABLET BY MOUTH TWO TIMES A WEEK stated she has been waiting for an answer in regards to medication. I advised the message on chart. Caller got upset and stated she would like a call back so she wont be wasting her time or our time. CB [phone]

## 2023-12-13 NOTE — Telephone Encounter (Signed)
 I attempted to call pt this morning with no answer, detailed message was left in her mailbox.

## 2023-12-13 NOTE — Telephone Encounter (Signed)
 Called pt no answer, lvm regarding lab needed per provider.

## 2023-12-19 ENCOUNTER — Other Ambulatory Visit: Payer: Self-pay

## 2023-12-19 DIAGNOSIS — M109 Gout, unspecified: Secondary | ICD-10-CM

## 2023-12-20 ENCOUNTER — Other Ambulatory Visit: Payer: Self-pay | Admitting: Physician Assistant

## 2023-12-20 ENCOUNTER — Ambulatory Visit: Payer: Self-pay | Admitting: Physician Assistant

## 2023-12-20 LAB — URIC ACID: Uric Acid: 6.7 mg/dL (ref 3.1–7.9)

## 2023-12-20 NOTE — Telephone Encounter (Unsigned)
 Copied from CRM #[id number]. Topic: Clinical - Medication Refill >> Dec 20, 2023  2:48 PM Winona R wrote: Medication: allopurinol  (ZYLOPRIM ) 100 MG tablet [[id number]]  Has the patient contacted their pharmacy? No (Agent: If no, request that the patient contact the pharmacy for the refill. If patient does not wish to contact the pharmacy document the reason why and proceed with request.) (Agent: If yes, when and what did the pharmacy advise?)  This is the patient's preferred pharmacy:  [facility] - [place], [place] - [address] [place] [address] Phone: [phone] Fax: [phone]  Is this the correct pharmacy for this prescription? Yes If no, delete pharmacy and type the correct one.   Has the prescription been filled recently? No  Is the patient out of the medication? Yes  Has the patient been seen for an appointment in the last year OR does the patient have an upcoming appointment? Yes  Can we respond through MyChart? no  Agent: Please be advised that Rx refills may take up to 3 business days. We ask that you follow-up with your pharmacy.

## 2023-12-20 NOTE — Progress Notes (Signed)
 Uric acid within normal limits.  Considering that your kidney function we will not change allopurinol  prescription

## 2023-12-21 ENCOUNTER — Other Ambulatory Visit: Payer: Self-pay

## 2023-12-21 MED ORDER — ALLOPURINOL 100 MG PO TABS: 50.0000 mg | ORAL_TABLET | ORAL | 0 refills | Status: AC

## 2023-12-21 NOTE — Progress Notes (Signed)
 Pt has been advised of results by E2C2.

## 2023-12-21 NOTE — Telephone Encounter (Signed)
 Spoke with pt, we were awaiting on Uric acid blood work. I have sent in medication to pharmacy as per note for Sherry Long on results no changes needed to be made.

## 2023-12-21 NOTE — Telephone Encounter (Signed)
 The patient called back stating she had an appointment and got labs done and everything was within limits concerning hr uric acid. She would like her allopurinol  (ZYLOPRIM ) 100 MG tablet called into her pharmacy so she can pick it up. Please assist patient further

## 2024-04-08 ENCOUNTER — Ambulatory Visit: Admitting: Physician Assistant

## 2024-04-22 ENCOUNTER — Ambulatory Visit
